# Patient Record
Sex: Male | Born: 1955 | Race: White | Hispanic: No | Smoking: Former smoker
Health system: Southern US, Community
[De-identification: ages and names within clinical notes are randomized; demographics above are authoritative.]

## PROBLEM LIST (undated history)

## (undated) DIAGNOSIS — R252 Cramp and spasm: Secondary | ICD-10-CM

## (undated) DIAGNOSIS — H9190 Unspecified hearing loss, unspecified ear: Secondary | ICD-10-CM

## (undated) DIAGNOSIS — I10 Essential (primary) hypertension: Secondary | ICD-10-CM

## (undated) DIAGNOSIS — J9690 Respiratory failure, unspecified, unspecified whether with hypoxia or hypercapnia: Secondary | ICD-10-CM

## (undated) DIAGNOSIS — L89159 Pressure ulcer of sacral region, unspecified stage: Secondary | ICD-10-CM

## (undated) DIAGNOSIS — Z87442 Personal history of urinary calculi: Secondary | ICD-10-CM

## (undated) DIAGNOSIS — R51 Headache: Secondary | ICD-10-CM

## (undated) DIAGNOSIS — N390 Urinary tract infection, site not specified: Secondary | ICD-10-CM

## (undated) DIAGNOSIS — R6521 Severe sepsis with septic shock: Secondary | ICD-10-CM

## (undated) DIAGNOSIS — E7800 Pure hypercholesterolemia, unspecified: Secondary | ICD-10-CM

## (undated) DIAGNOSIS — M8660 Other chronic osteomyelitis, unspecified site: Secondary | ICD-10-CM

## (undated) DIAGNOSIS — Z960 Presence of urogenital implants: Secondary | ICD-10-CM

## (undated) DIAGNOSIS — A419 Sepsis, unspecified organism: Secondary | ICD-10-CM

## (undated) DIAGNOSIS — R569 Unspecified convulsions: Secondary | ICD-10-CM

## (undated) DIAGNOSIS — J189 Pneumonia, unspecified organism: Secondary | ICD-10-CM

## (undated) DIAGNOSIS — C801 Malignant (primary) neoplasm, unspecified: Secondary | ICD-10-CM

## (undated) DIAGNOSIS — Z978 Presence of other specified devices: Secondary | ICD-10-CM

## (undated) DIAGNOSIS — N10 Acute tubulo-interstitial nephritis: Secondary | ICD-10-CM

## (undated) DIAGNOSIS — I6789 Other cerebrovascular disease: Secondary | ICD-10-CM

## (undated) DIAGNOSIS — R42 Dizziness and giddiness: Secondary | ICD-10-CM

## (undated) DIAGNOSIS — K219 Gastro-esophageal reflux disease without esophagitis: Secondary | ICD-10-CM

## (undated) DIAGNOSIS — K922 Gastrointestinal hemorrhage, unspecified: Secondary | ICD-10-CM

## (undated) DIAGNOSIS — E119 Type 2 diabetes mellitus without complications: Secondary | ICD-10-CM

## (undated) DIAGNOSIS — N400 Enlarged prostate without lower urinary tract symptoms: Secondary | ICD-10-CM

## (undated) DIAGNOSIS — F0390 Unspecified dementia without behavioral disturbance: Secondary | ICD-10-CM

## (undated) HISTORY — PX: HERNIA REPAIR: SHX51

## (undated) HISTORY — PX: OTHER SURGICAL HISTORY: SHX169

---

## 2004-01-10 ENCOUNTER — Emergency Department (HOSPITAL_COMMUNITY): Admission: EM | Admit: 2004-01-10 | Discharge: 2004-01-10 | Payer: Self-pay | Admitting: Emergency Medicine

## 2004-06-03 ENCOUNTER — Emergency Department (HOSPITAL_COMMUNITY): Admission: EM | Admit: 2004-06-03 | Discharge: 2004-06-03 | Payer: Self-pay | Admitting: Family Medicine

## 2004-10-23 ENCOUNTER — Emergency Department (HOSPITAL_COMMUNITY): Admission: EM | Admit: 2004-10-23 | Discharge: 2004-10-24 | Payer: Self-pay | Admitting: Emergency Medicine

## 2004-12-01 ENCOUNTER — Emergency Department (HOSPITAL_COMMUNITY): Admission: EM | Admit: 2004-12-01 | Discharge: 2004-12-01 | Payer: Self-pay | Admitting: Family Medicine

## 2006-05-07 ENCOUNTER — Inpatient Hospital Stay (HOSPITAL_COMMUNITY): Admission: EM | Admit: 2006-05-07 | Discharge: 2006-05-09 | Payer: Self-pay | Admitting: Emergency Medicine

## 2006-05-08 ENCOUNTER — Encounter (INDEPENDENT_AMBULATORY_CARE_PROVIDER_SITE_OTHER): Payer: Self-pay | Admitting: Interventional Cardiology

## 2006-05-08 ENCOUNTER — Ambulatory Visit: Payer: Self-pay | Admitting: Vascular Surgery

## 2006-05-08 ENCOUNTER — Encounter: Payer: Self-pay | Admitting: Vascular Surgery

## 2006-05-08 ENCOUNTER — Encounter: Payer: Self-pay | Admitting: Cardiology

## 2006-05-16 ENCOUNTER — Ambulatory Visit: Payer: Self-pay | Admitting: Cardiology

## 2006-05-23 ENCOUNTER — Ambulatory Visit (HOSPITAL_COMMUNITY): Admission: RE | Admit: 2006-05-23 | Discharge: 2006-05-23 | Payer: Self-pay | Admitting: Neurology

## 2006-05-30 ENCOUNTER — Ambulatory Visit (HOSPITAL_COMMUNITY): Admission: RE | Admit: 2006-05-30 | Discharge: 2006-05-30 | Payer: Self-pay | Admitting: Neurology

## 2006-06-20 ENCOUNTER — Ambulatory Visit: Payer: Self-pay | Admitting: Cardiology

## 2006-06-20 ENCOUNTER — Encounter: Payer: Self-pay | Admitting: Physician Assistant

## 2006-06-27 ENCOUNTER — Ambulatory Visit: Payer: Self-pay | Admitting: Cardiology

## 2006-06-27 ENCOUNTER — Encounter: Payer: Self-pay | Admitting: Cardiology

## 2008-01-02 ENCOUNTER — Ambulatory Visit (HOSPITAL_COMMUNITY): Admission: RE | Admit: 2008-01-02 | Discharge: 2008-01-02 | Payer: Self-pay | Admitting: Family Medicine

## 2008-01-02 ENCOUNTER — Ambulatory Visit (HOSPITAL_COMMUNITY): Admission: RE | Admit: 2008-01-02 | Discharge: 2008-01-02 | Payer: Self-pay | Admitting: Urology

## 2008-01-08 ENCOUNTER — Ambulatory Visit (HOSPITAL_COMMUNITY): Admission: RE | Admit: 2008-01-08 | Discharge: 2008-01-08 | Payer: Self-pay | Admitting: Urology

## 2008-01-10 ENCOUNTER — Emergency Department (HOSPITAL_COMMUNITY): Admission: EM | Admit: 2008-01-10 | Discharge: 2008-01-10 | Payer: Self-pay | Admitting: Emergency Medicine

## 2008-01-13 ENCOUNTER — Ambulatory Visit (HOSPITAL_COMMUNITY): Admission: RE | Admit: 2008-01-13 | Discharge: 2008-01-13 | Payer: Self-pay | Admitting: Urology

## 2008-01-22 ENCOUNTER — Ambulatory Visit (HOSPITAL_COMMUNITY): Admission: RE | Admit: 2008-01-22 | Discharge: 2008-01-22 | Payer: Self-pay | Admitting: Urology

## 2008-02-24 ENCOUNTER — Ambulatory Visit: Payer: Self-pay | Admitting: Internal Medicine

## 2008-02-24 ENCOUNTER — Encounter: Payer: Self-pay | Admitting: Cardiology

## 2008-02-24 ENCOUNTER — Inpatient Hospital Stay (HOSPITAL_COMMUNITY): Admission: EM | Admit: 2008-02-24 | Discharge: 2008-02-25 | Payer: Self-pay | Admitting: Cardiology

## 2008-02-24 ENCOUNTER — Encounter: Payer: Self-pay | Admitting: Emergency Medicine

## 2008-03-16 ENCOUNTER — Ambulatory Visit: Payer: Self-pay | Admitting: Cardiology

## 2008-03-23 ENCOUNTER — Encounter: Payer: Self-pay | Admitting: Cardiology

## 2008-08-13 ENCOUNTER — Encounter (INDEPENDENT_AMBULATORY_CARE_PROVIDER_SITE_OTHER): Payer: Self-pay | Admitting: *Deleted

## 2008-08-13 ENCOUNTER — Encounter: Payer: Self-pay | Admitting: Physician Assistant

## 2008-09-08 ENCOUNTER — Ambulatory Visit: Payer: Self-pay | Admitting: Gastroenterology

## 2008-09-08 DIAGNOSIS — R634 Abnormal weight loss: Secondary | ICD-10-CM

## 2008-09-08 DIAGNOSIS — K921 Melena: Secondary | ICD-10-CM

## 2008-09-08 DIAGNOSIS — R110 Nausea: Secondary | ICD-10-CM

## 2008-09-08 DIAGNOSIS — Z8639 Personal history of other endocrine, nutritional and metabolic disease: Secondary | ICD-10-CM

## 2008-09-08 DIAGNOSIS — R109 Unspecified abdominal pain: Secondary | ICD-10-CM

## 2008-09-08 DIAGNOSIS — R198 Other specified symptoms and signs involving the digestive system and abdomen: Secondary | ICD-10-CM | POA: Insufficient documentation

## 2008-09-08 DIAGNOSIS — Z862 Personal history of diseases of the blood and blood-forming organs and certain disorders involving the immune mechanism: Secondary | ICD-10-CM

## 2008-09-09 ENCOUNTER — Encounter: Payer: Self-pay | Admitting: Gastroenterology

## 2008-09-11 LAB — CONVERTED CEMR LAB
ALT: <8 units/L (ref 0–53)
AST: 9 units/L (ref 0–37)
Alkaline Phosphatase: 65 units/L (ref 39–117)
BUN: 15 mg/dL (ref 6–23)
CO2: 22 meq/L (ref 19–32)
Creatinine, Ser: 1.21 mg/dL (ref 0.40–1.50)
Eosinophils Absolute: 0.1 10*3/uL (ref 0.0–0.7)
HCT: 43.8 % (ref 39.0–52.0)
Lymphs Abs: 2.2 10*3/uL (ref 0.7–4.0)
MCHC: 33.6 g/dL (ref 30.0–36.0)
Monocytes Absolute: 0.4 10*3/uL (ref 0.1–1.0)
Monocytes Relative: 5 % (ref 3–12)
Neutro Abs: 5.3 10*3/uL (ref 1.7–7.7)
Neutrophils Relative %: 66 % (ref 43–77)
Platelets: 238 10*3/uL (ref 150–400)
RBC: 5.15 M/uL (ref 4.22–5.81)
RDW: 13.3 % (ref 11.5–15.5)
Sodium: 143 meq/L (ref 135–145)
TSH: 0.714 microintl units/mL (ref 0.350–4.500)
Total Protein: 6.3 g/dL (ref 6.0–8.3)
WBC: 8.1 10*3/uL (ref 4.0–10.5)

## 2008-09-16 ENCOUNTER — Encounter: Payer: Self-pay | Admitting: Gastroenterology

## 2008-09-28 ENCOUNTER — Telehealth (INDEPENDENT_AMBULATORY_CARE_PROVIDER_SITE_OTHER): Payer: Self-pay | Admitting: *Deleted

## 2008-10-05 ENCOUNTER — Encounter: Payer: Self-pay | Admitting: Gastroenterology

## 2008-10-16 ENCOUNTER — Encounter: Payer: Self-pay | Admitting: Gastroenterology

## 2008-10-16 ENCOUNTER — Ambulatory Visit (HOSPITAL_COMMUNITY): Admission: RE | Admit: 2008-10-16 | Discharge: 2008-10-16 | Payer: Self-pay | Admitting: Gastroenterology

## 2008-10-16 ENCOUNTER — Ambulatory Visit: Payer: Self-pay | Admitting: Gastroenterology

## 2008-11-18 ENCOUNTER — Inpatient Hospital Stay (HOSPITAL_COMMUNITY): Admission: EM | Admit: 2008-11-18 | Discharge: 2008-11-20 | Payer: Self-pay | Admitting: Emergency Medicine

## 2008-11-24 ENCOUNTER — Encounter (HOSPITAL_COMMUNITY): Admission: RE | Admit: 2008-11-24 | Discharge: 2008-12-24 | Payer: Self-pay | Admitting: Internal Medicine

## 2008-11-26 ENCOUNTER — Ambulatory Visit: Payer: Self-pay | Admitting: Gastroenterology

## 2008-11-26 DIAGNOSIS — K5900 Constipation, unspecified: Secondary | ICD-10-CM | POA: Insufficient documentation

## 2008-11-27 ENCOUNTER — Encounter: Payer: Self-pay | Admitting: Emergency Medicine

## 2008-11-27 ENCOUNTER — Encounter (INDEPENDENT_AMBULATORY_CARE_PROVIDER_SITE_OTHER): Payer: Self-pay | Admitting: Neurology

## 2008-11-27 ENCOUNTER — Inpatient Hospital Stay (HOSPITAL_COMMUNITY): Admission: EM | Admit: 2008-11-27 | Discharge: 2008-12-01 | Payer: Self-pay | Admitting: Neurology

## 2008-11-28 DIAGNOSIS — I10 Essential (primary) hypertension: Secondary | ICD-10-CM | POA: Insufficient documentation

## 2008-11-28 DIAGNOSIS — I2510 Atherosclerotic heart disease of native coronary artery without angina pectoris: Secondary | ICD-10-CM

## 2008-11-28 DIAGNOSIS — R011 Cardiac murmur, unspecified: Secondary | ICD-10-CM | POA: Insufficient documentation

## 2008-11-28 DIAGNOSIS — E785 Hyperlipidemia, unspecified: Secondary | ICD-10-CM | POA: Insufficient documentation

## 2008-11-30 ENCOUNTER — Encounter (INDEPENDENT_AMBULATORY_CARE_PROVIDER_SITE_OTHER): Payer: Self-pay | Admitting: Neurology

## 2008-12-15 ENCOUNTER — Encounter (HOSPITAL_COMMUNITY): Admission: RE | Admit: 2008-12-15 | Discharge: 2009-01-14 | Payer: Self-pay | Admitting: Neurology

## 2009-01-19 ENCOUNTER — Encounter (HOSPITAL_COMMUNITY): Admission: RE | Admit: 2009-01-19 | Discharge: 2009-02-17 | Payer: Self-pay | Admitting: Neurology

## 2009-01-26 ENCOUNTER — Emergency Department (HOSPITAL_COMMUNITY): Admission: EM | Admit: 2009-01-26 | Discharge: 2009-01-26 | Payer: Self-pay | Admitting: Emergency Medicine

## 2009-02-24 ENCOUNTER — Encounter (HOSPITAL_COMMUNITY): Admission: RE | Admit: 2009-02-24 | Discharge: 2009-03-26 | Payer: Self-pay | Admitting: Neurology

## 2009-08-19 ENCOUNTER — Ambulatory Visit (HOSPITAL_COMMUNITY): Admission: RE | Admit: 2009-08-19 | Discharge: 2009-08-19 | Payer: Self-pay | Admitting: Urology

## 2009-08-25 ENCOUNTER — Ambulatory Visit (HOSPITAL_COMMUNITY): Admission: RE | Admit: 2009-08-25 | Discharge: 2009-08-25 | Payer: Self-pay | Admitting: Urology

## 2009-08-26 ENCOUNTER — Emergency Department (HOSPITAL_COMMUNITY): Admission: EM | Admit: 2009-08-26 | Discharge: 2009-08-27 | Payer: Self-pay | Admitting: Emergency Medicine

## 2009-09-03 ENCOUNTER — Encounter: Admission: RE | Admit: 2009-09-03 | Discharge: 2009-09-03 | Payer: Self-pay | Admitting: Neurology

## 2009-09-07 ENCOUNTER — Telehealth (INDEPENDENT_AMBULATORY_CARE_PROVIDER_SITE_OTHER): Payer: Self-pay | Admitting: *Deleted

## 2009-09-13 ENCOUNTER — Ambulatory Visit: Payer: Self-pay | Admitting: Physician Assistant

## 2009-09-17 ENCOUNTER — Encounter: Payer: Self-pay | Admitting: Physician Assistant

## 2009-09-17 ENCOUNTER — Telehealth (INDEPENDENT_AMBULATORY_CARE_PROVIDER_SITE_OTHER): Payer: Self-pay | Admitting: *Deleted

## 2009-10-20 ENCOUNTER — Ambulatory Visit (HOSPITAL_COMMUNITY): Admission: RE | Admit: 2009-10-20 | Discharge: 2009-10-20 | Payer: Self-pay | Admitting: General Surgery

## 2009-11-24 ENCOUNTER — Emergency Department (HOSPITAL_COMMUNITY): Admission: EM | Admit: 2009-11-24 | Discharge: 2009-11-25 | Payer: Self-pay | Admitting: Emergency Medicine

## 2010-01-24 ENCOUNTER — Inpatient Hospital Stay (HOSPITAL_COMMUNITY): Admission: EM | Admit: 2010-01-24 | Discharge: 2010-01-26 | Payer: Self-pay | Source: Home / Self Care

## 2010-01-25 ENCOUNTER — Encounter (INDEPENDENT_AMBULATORY_CARE_PROVIDER_SITE_OTHER): Payer: Self-pay | Admitting: Internal Medicine

## 2010-02-20 DIAGNOSIS — G9689 Other specified disorders of central nervous system: Secondary | ICD-10-CM

## 2010-02-20 HISTORY — DX: Other specified disorders of central nervous system: G96.89

## 2010-03-13 ENCOUNTER — Encounter: Payer: Self-pay | Admitting: Urology

## 2010-03-22 NOTE — Assessment & Plan Note (Signed)
Summary: needs pre-op clearance for lithotripsy --agh  Medications Added METOPROLOL TARTRATE 50 MG TABS (METOPROLOL TARTRATE) Take 1 tablet by mouth two times a day DEPAKOTE ER 500 MG XR24H-TAB (DIVALPROEX SODIUM) Take 1 tablet by mouth once a day ASPIR-TRIN 325 MG TBEC (ASPIRIN) Take 1 tablet by mouth once a day SIMVASTATIN 40 MG TABS (SIMVASTATIN) Take 1 tablet by mouth once a day      Allergies Added: NKDA  Visit Type:  surgical clearance for kidney stone removal Referring Provider:  McGough Primary Provider:  Dr. Regino Schultze   History of Present Illness: patient is a 55 year old male with history of nonobstructive CAD and normal LV function. He was last seen here in the clinic in January 2010, by Dr. Andee Lineman. He now presents for repeat preoperative clearance, again for upcoming lithotripsy. This time, he was found to have left-sided nephrolithiasis, and is scheduled to undergo lithotripsy later this week.  From a cardiac standpoint, he denies any interim developed of symptoms suggestive of unstable angina pectoris. He denies exertional dyspnea or tachycardia palpitations. Unfortunately continues to smoke.  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 He was treated with TPA. An echocardiogram at that time indicated normal LVF (EF 60-65%), moderate aortic regurgitation, and no cardiac source of emboli.  Preventive Screening-Counseling & Management  Alcohol-Tobacco     Smoking Status: current     Smoking Cessation Counseling: yes     Packs/Day: <1/2 PPD  Current Medications (verified): 1)  Lisinopril 10 Mg Tabs (Lisinopril) .... Take 1 Tablet By Mouth Once A Day 2)  Aciphex 20 Mg Tbec (Rabeprazole Sodium) .... Take 1 Tablet By Mouth Two Times A Day 3)  Metoprolol Tartrate 50 Mg Tabs (Metoprolol Tartrate) .... Take 1 Tablet By Mouth Two Times A Day 4)  Depakote Er 500 Mg Xr24h-Tab (Divalproex  Sodium) .... Take 1 Tablet By Mouth Once A Day 5)  Aspir-Trin 325 Mg Tbec (Aspirin) .... Take 1 Tablet By Mouth Once A Day 6)  Simvastatin 40 Mg Tabs (Simvastatin) .... Take 1 Tablet By Mouth Once A Day  Allergies (verified): No Known Drug Allergies  Comments:  Nurse/Medical Assistant: The patient's medication list and allergies were reviewed with the patient and were updated in the Medication and Allergy Lists.  Past History:  Past Medical History: Diabetes Hearing loss Kidney Stones, lithotripsy, Dr. Jerre Simon, h/o stent before CAD, NATIVE VESSEL (ICD-414.01) MURMUR (ICD-785.2) HYPERTENSION, UNSPECIFIED (ICD-401.9) HYPERLIPIDEMIA-MIXED (ICD-272.4) CONSTIPATION (ICD-564.00) Hx of THYROID NODULE, HX OF (ICD-V12.2) HEMATOCHEZIA (ICD-578.1) NAUSEA, CHRONIC (ICD-787.02) ABDOMINAL PAIN OTHER SPECIFIED SITE (ICD-789.09) CHANGE IN BOWELS (ICD-787.99) Left brain stroke, 10/10 WEIGHT LOSS, ABNORMAL (ICD-783.21)  Social History: Smoking Status:  current Packs/Day:  <1/2 PPD  Review of Systems       No fevers, chills, hemoptysis, dysphagia, melena, hematocheezia, hematuria, rash, claudication, orthopnea, pnd, pedal edema. patient has residual right-sided hemiparesis some and ambulates with use of cane or walker. BasedAll other systems negative.   Vital Signs:  Patient profile:   55 year old male Height:      72 inches Weight:      170 pounds BMI:     23.14 Pulse rate:   93 / minute BP sitting:   102 / 68  (left arm) Cuff size:   regular  Vitals Entered By: Carlye Grippe (September 13, 2009 1:05 PM)  Physical Exam  Additional Exam:  GEN:55 year old male, sitting upright, in no distress HEENT: NCAT,PERRLA,EOMI NECK:  palpable pulses, no bruits; no JVD; left sided thyroid mass LUNGS: CTA bilaterally HEART: RRR (S1S2); no significant murmurs; no rubs; no gallops ABD: soft, NT; intact BS EXT: intact distal pulses; no edema SKIN: warm, dry MUSC: no obvious deformity NEURO: A/O  (x3)     EKG  Procedure date:  09/13/2009  Findings:      normal sinus rhythm and 96 bpm; normal axis; no acute changes  Impression & Recommendations:  Problem # 1:  CAD, NATIVE VESSEL (ICD-414.01)  no further cardiac workup is indicated. Patient has history of nonobstructive CAD with normal LV function by previous catheterization, and presents with no interim development of signs or symptoms suggestive of unstable angina pectoris. Therefore, he is cleared to proceed with lithotripsy later this week, with Dr. Jerre Simon. He is to otherwise return for clinic followup with Dr. Andee Lineman in one year.  Problem # 2:  Hx of THYROID NODULE, HX OF (ICD-V12.2)  a repeat ultrasound of the thyroid will be ordered, with results forwarded to Dr. Regino Schultze. His previous study, February 2010, revealed evidence of multinodular goiter, with a dominant partially calcified mass in the previously biopsied left lobe of the thyroid. Recommend that patient followup with an endocrinologist for review of these results. Will defer to Dr.McGough for further recommendations.  Orders: Ultrasound (Ultrasound)  Problem # 3:  HYPERLIPIDEMIA-MIXED (ICD-272.4)  aggressive lipid management recommended, with target LDL of 70 or less, if feasible. We'll defer to Dr. Regino Schultze for ongoing monitoring and management.  Other Orders: EKG w/ Interpretation (93000)  Patient Instructions: 1)  Your physician wants you to follow-up in: 1 year. You will receive a reminder letter in the mail one-two months in advance. If you don't receive a letter, please call our office to schedule the follow-up appointment. 2)  Thyroid Ultrasound. 3)  You are cleared for Lithotripsy with Dr. Jerre Simon. A copy of your office visit from today will be faxed to him.

## 2010-03-22 NOTE — Progress Notes (Signed)
Summary: surgical clearance   Phone Note Other Incoming   Caller: [facility] via fax Summary of Call: Requesting surgical clearance for hernia repair.  Last seen July for clearance for lithotripsy.  Can you make addendum to that note for this surgery? Hoover Brunette, LPN  September 17, 2009 11:40 AM   Follow-up for Phone Call        See flag.  Follow-up by: Lewayne Bunting, MD, [facility],  September 19, 2009 3:49 PM  Additional Follow-up for Phone Call Additional follow up Details #1::        Patient not seen by me since 02/2008. Probably should have brief visit per Dr. Andee Lineman.  You are correct, but Gene saw him this July for clearance for lithotripsy.  Do we really have to make him come in again or can Gene poss. clear based off his note?  If you still want to see him, just let me know when and where to add to your schedule. Forward to Gene.  Lewayne Bunting, MD, [facility]  September 22, 2009 1:19 PM    Additional Follow-up by: Hoover Brunette, LPN,  September 21, 2009 4:31 PM    Additional Follow-up for Phone Call Additional follow up Details #2::    there is no need for pt to come back to the office. he was cleared for lithotripsy, by me, on 7/25, and is cleared to now proceed with hernia repair, unless he has developed angina pectoris, since his recent office visit. Follow-up by: Nelida Meuse, PA-C,  September 22, 2009 1:36 PM  Additional Follow-up for Phone Call Additional follow up Details #3:: Details for Additional Follow-up Action Taken: Will fax this note to [facility]. Hoover Brunette, LPN  September 22, 2009 1:50 PM

## 2010-03-22 NOTE — Cardiovascular Report (Signed)
Summary: Cardiac Catheterization  Cardiac Catheterization   Imported By: Dorise Hiss 10/07/2009 14:07:16  _____________________________________________________________________  External Attachment:    Type:   Image     Comment:   External Document

## 2010-03-22 NOTE — Progress Notes (Signed)
Summary: PHONE: PRE OP CLEARANCE     Phone Note From Other Clinic   Caller: [email or web address]-[email or web address] Request: Talk with Provider Details for Reason: [phone]-PRE OP CLEARANCE Summary of Call: Mr. Counterman needs to have Lipthotritsy (has not been scheduled yet) Dr. Jerre Simon is wanting to know if he needs clearance before this procedure. Initial call taken by: Zachary George,  September 07, 2009 10:25 AM  Follow-up for Phone Call        Notified Rosann Auerbach, patient not seen since 02/2008.  Will need OV here for clearance first.  Stated she would cancell procedure for tomorrow. Left message for pt to return call. Hoover Brunette, LPN  September 07, 2009 12:21 PM   Additional Follow-up for Phone Call Additional follow up Details #1::        OV scheduled for 7/25 with GS for clearance. Girlfriend Maureen Ralphs Shorter) notified.    Additional Follow-up by: Hoover Brunette, LPN,  September 08, 2009 4:12 PM

## 2010-05-03 LAB — CK TOTAL AND CKMB (NOT AT ARMC): Relative Index: INVALID (ref 0.0–2.5)

## 2010-05-03 LAB — COMPREHENSIVE METABOLIC PANEL
ALT: 10 U/L (ref 0–53)
ALT: 14 U/L (ref 0–53)
AST: 14 U/L (ref 0–37)
AST: 15 U/L (ref 0–37)
Alkaline Phosphatase: 72 U/L (ref 39–117)
CO2: 24 mEq/L (ref 19–32)
Calcium: 10 mg/dL (ref 8.4–10.5)
Calcium: 10.6 mg/dL — ABNORMAL HIGH (ref 8.4–10.5)
GFR calc Af Amer: >60 mL/min (ref >60)
GFR calc Af Amer: >60 mL/min (ref >60)
GFR calc non Af Amer: >60 mL/min (ref >60)
Glucose, Bld: 114 mg/dL — ABNORMAL HIGH (ref 70–99)
Potassium: 4.1 mEq/L (ref 3.5–5.1)
Sodium: 143 mEq/L (ref 135–145)
Sodium: 144 mEq/L (ref 135–145)
Total Protein: 6 g/dL (ref 6.0–8.3)

## 2010-05-03 LAB — VITAMIN D 1,25 DIHYDROXY
Vitamin D 1, 25 (OH)2 Total: 48 pg/mL (ref 18–72)
Vitamin D2 1, 25 (OH)2: <8 pg/mL
Vitamin D3 1, 25 (OH)2: 48 pg/mL

## 2010-05-03 LAB — T4, FREE: Free T4: 0.99 ng/dL (ref 0.80–1.80)

## 2010-05-03 LAB — GLUCOSE, CAPILLARY: Glucose-Capillary: 107 mg/dL — ABNORMAL HIGH (ref 70–99)

## 2010-05-03 LAB — CBC
MCH: 29.7 pg (ref 26.0–34.0)
MCHC: 34.1 g/dL (ref 30.0–36.0)
MCHC: 35.8 g/dL (ref 30.0–36.0)
MCV: 82.9 fL (ref 78.0–100.0)
Platelets: 210 10*3/uL (ref 150–400)
Platelets: 235 10*3/uL (ref 150–400)
RBC: 5.02 MIL/uL (ref 4.22–5.81)
RDW: 12.9 % (ref 11.5–15.5)

## 2010-05-03 LAB — LIPID PANEL
Cholesterol: 231 mg/dL — ABNORMAL HIGH (ref 0–200)
LDL Cholesterol: 152 mg/dL — ABNORMAL HIGH (ref 0–99)

## 2010-05-03 LAB — HOMOCYSTEINE: Homocysteine: 10.9 umol/L (ref 4.0–15.4)

## 2010-05-03 LAB — DIFFERENTIAL
Basophils Relative: 0 % (ref 0–1)
Eosinophils Absolute: 0.2 10*3/uL (ref 0.0–0.7)
Eosinophils Absolute: 0.2 10*3/uL (ref 0.0–0.7)
Eosinophils Relative: 2 % (ref 0–5)
Lymphs Abs: 2.4 10*3/uL (ref 0.7–4.0)
Lymphs Abs: 3.5 10*3/uL (ref 0.7–4.0)
Monocytes Relative: 5 % (ref 3–12)
Monocytes Relative: 6 % (ref 3–12)
Neutrophils Relative %: 63 % (ref 43–77)

## 2010-05-03 LAB — PROTIME-INR: Prothrombin Time: 12.1 seconds (ref 11.6–15.2)

## 2010-05-05 LAB — CBC
HCT: 39.8 % (ref 39.0–52.0)
Hemoglobin: 13.8 g/dL (ref 13.0–17.0)
MCV: 87.1 fL (ref 78.0–100.0)
RBC: 4.57 MIL/uL (ref 4.22–5.81)
WBC: 9.4 10*3/uL (ref 4.0–10.5)

## 2010-05-05 LAB — DIFFERENTIAL
Eosinophils Relative: 2 % (ref 0–5)
Lymphocytes Relative: 33 % (ref 12–46)
Lymphs Abs: 3.1 10*3/uL (ref 0.7–4.0)
Monocytes Absolute: 0.5 10*3/uL (ref 0.1–1.0)
Monocytes Relative: 5 % (ref 3–12)

## 2010-05-05 LAB — URINALYSIS, ROUTINE W REFLEX MICROSCOPIC
Glucose, UA: NEGATIVE mg/dL
Ketones, ur: NEGATIVE mg/dL
pH: 6 (ref 5.0–8.0)

## 2010-05-05 LAB — BASIC METABOLIC PANEL
BUN: 14 mg/dL (ref 6–23)
Chloride: 106 mEq/L (ref 96–112)
GFR calc Af Amer: >60 mL/min (ref >60)
Potassium: 4.4 mEq/L (ref 3.5–5.1)

## 2010-05-06 LAB — CBC
Hemoglobin: 15.4 g/dL (ref 13.0–17.0)
MCHC: 34.2 g/dL (ref 30.0–36.0)
Platelets: 204 10*3/uL (ref 150–400)
RBC: 5.1 MIL/uL (ref 4.22–5.81)

## 2010-05-06 LAB — GLUCOSE, CAPILLARY: Glucose-Capillary: 143 mg/dL — ABNORMAL HIGH (ref 70–99)

## 2010-05-06 LAB — SURGICAL PCR SCREEN
MRSA, PCR: NEGATIVE
Staphylococcus aureus: NEGATIVE

## 2010-05-06 LAB — COMPREHENSIVE METABOLIC PANEL
AST: 18 U/L (ref 0–37)
Alkaline Phosphatase: 54 U/L (ref 39–117)
BUN: 14 mg/dL (ref 6–23)
CO2: 29 mEq/L (ref 19–32)
Chloride: 108 mEq/L (ref 96–112)
Creatinine, Ser: 1.18 mg/dL (ref 0.4–1.5)
GFR calc non Af Amer: >60 mL/min (ref >60)
Total Bilirubin: 0.6 mg/dL (ref 0.3–1.2)

## 2010-05-08 LAB — URINALYSIS, ROUTINE W REFLEX MICROSCOPIC
Bilirubin Urine: NEGATIVE
Glucose, UA: NEGATIVE mg/dL
pH: 6 (ref 5.0–8.0)

## 2010-05-08 LAB — BASIC METABOLIC PANEL
BUN: 17 mg/dL (ref 6–23)
CO2: 24 mEq/L (ref 19–32)
Chloride: 110 mEq/L (ref 96–112)
Potassium: 4 mEq/L (ref 3.5–5.1)

## 2010-05-08 LAB — CBC
HCT: 40.2 % (ref 39.0–52.0)
MCH: 29.6 pg (ref 26.0–34.0)
MCV: 85.5 fL (ref 78.0–100.0)
RDW: 13.4 % (ref 11.5–15.5)
WBC: 8.8 10*3/uL (ref 4.0–10.5)

## 2010-05-08 LAB — DIFFERENTIAL
Basophils Absolute: 0 10*3/uL (ref 0.0–0.1)
Eosinophils Relative: 1 % (ref 0–5)
Lymphocytes Relative: 22 % (ref 12–46)
Lymphs Abs: 1.9 10*3/uL (ref 0.7–4.0)
Monocytes Absolute: 0.4 10*3/uL (ref 0.1–1.0)

## 2010-05-24 LAB — URINALYSIS, ROUTINE W REFLEX MICROSCOPIC
Bilirubin Urine: NEGATIVE
Hgb urine dipstick: NEGATIVE
Specific Gravity, Urine: <1.005 — ABNORMAL LOW (ref 1.005–1.030)
pH: 6.5 (ref 5.0–8.0)

## 2010-05-26 LAB — GLUCOSE, CAPILLARY
Glucose-Capillary: 108 mg/dL — ABNORMAL HIGH (ref 70–99)
Glucose-Capillary: 128 mg/dL — ABNORMAL HIGH (ref 70–99)
Glucose-Capillary: 134 mg/dL — ABNORMAL HIGH (ref 70–99)
Glucose-Capillary: 92 mg/dL (ref 70–99)
Glucose-Capillary: 96 mg/dL (ref 70–99)
Glucose-Capillary: 98 mg/dL (ref 70–99)
Glucose-Capillary: 104 mg/dL — ABNORMAL HIGH (ref 70–99)
Glucose-Capillary: 122 mg/dL — ABNORMAL HIGH (ref 70–99)
Glucose-Capillary: 92 mg/dL (ref 70–99)

## 2010-05-26 LAB — DIFFERENTIAL
Eosinophils Relative: 1 % (ref 0–5)
Lymphocytes Relative: 28 % (ref 12–46)
Lymphs Abs: 2.2 10*3/uL (ref 0.7–4.0)
Monocytes Absolute: 0.3 10*3/uL (ref 0.1–1.0)

## 2010-05-26 LAB — COMPREHENSIVE METABOLIC PANEL
AST: 15 U/L (ref 0–37)
Albumin: 3.9 g/dL (ref 3.5–5.2)
Albumin: 4.3 g/dL (ref 3.5–5.2)
BUN: 13 mg/dL (ref 6–23)
Calcium: 10.4 mg/dL (ref 8.4–10.5)
Chloride: 108 mEq/L (ref 96–112)
Creatinine, Ser: 1.16 mg/dL (ref 0.4–1.5)
Creatinine, Ser: 0.9 mg/dL (ref 0.4–1.5)
GFR calc Af Amer: >60 mL/min (ref >60)
GFR calc non Af Amer: >60 mL/min (ref >60)
Glucose, Bld: 91 mg/dL (ref 70–99)
Total Bilirubin: 0.5 mg/dL (ref 0.3–1.2)
Total Protein: 6.8 g/dL (ref 6.0–8.3)

## 2010-05-26 LAB — CARDIAC PANEL(CRET KIN+CKTOT+MB+TROPI): Troponin I: 0.03 ng/mL (ref 0.00–0.06)

## 2010-05-26 LAB — CK TOTAL AND CKMB (NOT AT ARMC)
CK, MB: 2 ng/mL (ref 0.3–4.0)
Relative Index: INVALID (ref 0.0–2.5)

## 2010-05-26 LAB — LIPID PANEL
Cholesterol: 232 mg/dL — ABNORMAL HIGH (ref 0–200)
Total CHOL/HDL Ratio: 5.8 RATIO
VLDL: 35 mg/dL (ref 0–40)

## 2010-05-26 LAB — HEMOGLOBIN A1C
Hgb A1c MFr Bld: 5.4 % (ref 4.6–6.1)
Mean Plasma Glucose: 108 mg/dL

## 2010-05-26 LAB — CBC
HCT: 42.4 % (ref 39.0–52.0)
MCV: 87.8 fL (ref 78.0–100.0)
Platelets: 220 10*3/uL (ref 150–400)
Platelets: 223 10*3/uL (ref 150–400)
RDW: 13.3 % (ref 11.5–15.5)
WBC: 7.6 10*3/uL (ref 4.0–10.5)
WBC: 8 10*3/uL (ref 4.0–10.5)

## 2010-05-26 LAB — URINALYSIS, ROUTINE W REFLEX MICROSCOPIC
Glucose, UA: NEGATIVE mg/dL
Hgb urine dipstick: NEGATIVE
Specific Gravity, Urine: 1.024 (ref 1.005–1.030)

## 2010-05-26 LAB — TROPONIN I: Troponin I: 0.03 ng/mL (ref 0.00–0.06)

## 2010-05-26 LAB — PROTIME-INR
Prothrombin Time: 11.9 seconds (ref 11.6–15.2)
Prothrombin Time: 12.3 seconds (ref 11.6–15.2)

## 2010-05-27 LAB — CARDIAC PANEL(CRET KIN+CKTOT+MB+TROPI)
CK, MB: 1.3 ng/mL (ref 0.3–4.0)
Relative Index: INVALID (ref 0.0–2.5)
Total CK: 35 U/L (ref 7–232)
Total CK: 39 U/L (ref 7–232)
Troponin I: 0.03 ng/mL (ref 0.00–0.06)

## 2010-05-27 LAB — COMPREHENSIVE METABOLIC PANEL
ALT: 8 U/L (ref 0–53)
ALT: 9 U/L (ref 0–53)
AST: 14 U/L (ref 0–37)
Albumin: 4.2 g/dL (ref 3.5–5.2)
Alkaline Phosphatase: 65 U/L (ref 39–117)
BUN: 10 mg/dL (ref 6–23)
CO2: 29 mEq/L (ref 19–32)
Calcium: 10 mg/dL (ref 8.4–10.5)
Chloride: 109 mEq/L (ref 96–112)
Creatinine, Ser: 0.88 mg/dL (ref 0.4–1.5)
GFR calc Af Amer: >60 mL/min (ref >60)
GFR calc Af Amer: >60 mL/min (ref >60)
GFR calc non Af Amer: >60 mL/min (ref >60)
Glucose, Bld: 93 mg/dL (ref 70–99)
Potassium: 4.1 mEq/L (ref 3.5–5.1)
Sodium: 142 mEq/L (ref 135–145)
Sodium: 144 mEq/L (ref 135–145)
Total Bilirubin: 0.6 mg/dL (ref 0.3–1.2)
Total Protein: 6 g/dL (ref 6.0–8.3)

## 2010-05-27 LAB — RAPID URINE DRUG SCREEN, HOSP PERFORMED
Barbiturates: NOT DETECTED
Benzodiazepines: NOT DETECTED
Cocaine: NOT DETECTED

## 2010-05-27 LAB — GLUCOSE, CAPILLARY
Glucose-Capillary: 120 mg/dL — ABNORMAL HIGH (ref 70–99)
Glucose-Capillary: 150 mg/dL — ABNORMAL HIGH (ref 70–99)
Glucose-Capillary: 83 mg/dL (ref 70–99)
Glucose-Capillary: 90 mg/dL (ref 70–99)

## 2010-05-27 LAB — FOLATE: Folate: 7.9 ng/mL

## 2010-05-27 LAB — ETHANOL: Alcohol, Ethyl (B): <5 mg/dL (ref 0–10)

## 2010-05-27 LAB — DIFFERENTIAL
Basophils Absolute: 0.1 10*3/uL (ref 0.0–0.1)
Basophils Relative: 1 % (ref 0–1)
Eosinophils Absolute: 0.1 10*3/uL (ref 0.0–0.7)
Eosinophils Relative: 2 % (ref 0–5)
Lymphocytes Relative: 34 % (ref 12–46)
Lymphs Abs: 2.3 10*3/uL (ref 0.7–4.0)
Monocytes Absolute: 0.5 10*3/uL (ref 0.1–1.0)
Monocytes Relative: 5 % (ref 3–12)
Monocytes Relative: 6 % (ref 3–12)
Neutrophils Relative %: 59 % (ref 43–77)

## 2010-05-27 LAB — PROTIME-INR
INR: 1 (ref 0.00–1.49)
Prothrombin Time: 13.1 seconds (ref 11.6–15.2)

## 2010-05-27 LAB — RPR: RPR Ser Ql: NONREACTIVE

## 2010-05-27 LAB — VITAMIN B12: Vitamin B-12: 247 pg/mL (ref 211–911)

## 2010-05-27 LAB — CBC
HCT: 42 % (ref 39.0–52.0)
Hemoglobin: 14.4 g/dL (ref 13.0–17.0)
Hemoglobin: 14.7 g/dL (ref 13.0–17.0)
MCHC: 34.6 g/dL (ref 30.0–36.0)
MCV: 87 fL (ref 78.0–100.0)
RBC: 4.84 MIL/uL (ref 4.22–5.81)
RDW: 13.1 % (ref 11.5–15.5)
RDW: 13.3 % (ref 11.5–15.5)

## 2010-05-27 LAB — URINALYSIS, ROUTINE W REFLEX MICROSCOPIC
Glucose, UA: NEGATIVE mg/dL
Hgb urine dipstick: NEGATIVE
Protein, ur: NEGATIVE mg/dL

## 2010-05-27 LAB — HEMOGLOBIN A1C: Mean Plasma Glucose: 111 mg/dL

## 2010-05-27 LAB — CK TOTAL AND CKMB (NOT AT ARMC): CK, MB: 1.8 ng/mL (ref 0.3–4.0)

## 2010-05-27 LAB — ANA: Anti Nuclear Antibody(ANA): NEGATIVE

## 2010-05-27 LAB — SEDIMENTATION RATE: Sed Rate: 1 mm/hr (ref 0–16)

## 2010-05-27 LAB — HOMOCYSTEINE: Homocysteine: 14.6 umol/L (ref 4.0–15.4)

## 2010-05-27 LAB — APTT: aPTT: 27 seconds (ref 24–37)

## 2010-06-06 LAB — CBC
HCT: 39.6 % (ref 39.0–52.0)
HCT: 44 % (ref 39.0–52.0)
Hemoglobin: 13.6 g/dL (ref 13.0–17.0)
Hemoglobin: 14.9 g/dL (ref 13.0–17.0)
Platelets: 220 10*3/uL (ref 150–400)
RDW: 13.3 % (ref 11.5–15.5)
WBC: 8.9 10*3/uL (ref 4.0–10.5)

## 2010-06-06 LAB — BASIC METABOLIC PANEL
GFR calc Af Amer: >60 mL/min (ref >60)
GFR calc non Af Amer: >60 mL/min (ref >60)
GFR calc non Af Amer: >60 mL/min (ref >60)
Glucose, Bld: 131 mg/dL — ABNORMAL HIGH (ref 70–99)
Potassium: 3.8 mEq/L (ref 3.5–5.1)
Potassium: 3.6 mEq/L (ref 3.5–5.1)
Sodium: 143 mEq/L (ref 135–145)
Sodium: 140 mEq/L (ref 135–145)

## 2010-06-06 LAB — CK TOTAL AND CKMB (NOT AT ARMC)
CK, MB: 1.1 ng/mL (ref 0.3–4.0)
CK, MB: 1.1 ng/mL (ref 0.3–4.0)
CK, MB: 1.3 ng/mL (ref 0.3–4.0)
Total CK: 32 U/L (ref 7–232)
Total CK: 39 U/L (ref 7–232)

## 2010-06-06 LAB — PROTIME-INR
INR: 1 (ref 0.00–1.49)
Prothrombin Time: 13.4 seconds (ref 11.6–15.2)

## 2010-06-06 LAB — POCT CARDIAC MARKERS
CKMB, poc: <1 ng/mL — ABNORMAL LOW (ref 1.0–8.0)
Myoglobin, poc: 30.2 ng/mL (ref 12–200)
Troponin i, poc: <0.05 ng/mL (ref 0.00–0.09)
Troponin i, poc: <0.05 ng/mL (ref 0.00–0.09)

## 2010-06-06 LAB — LIPID PANEL
Cholesterol: 215 mg/dL — ABNORMAL HIGH (ref 0–200)
LDL Cholesterol: 147 mg/dL — ABNORMAL HIGH (ref 0–99)
Total CHOL/HDL Ratio: 7.4 RATIO
Triglycerides: 194 mg/dL — ABNORMAL HIGH (ref <150)
VLDL: 39 mg/dL (ref 0–40)

## 2010-06-06 LAB — COMPREHENSIVE METABOLIC PANEL
AST: 11 U/L (ref 0–37)
Albumin: 3.4 g/dL — ABNORMAL LOW (ref 3.5–5.2)
Alkaline Phosphatase: 59 U/L (ref 39–117)
BUN: 14 mg/dL (ref 6–23)
Chloride: 109 mEq/L (ref 96–112)
GFR calc Af Amer: >60 mL/min (ref >60)
Potassium: 3.7 mEq/L (ref 3.5–5.1)
Sodium: 142 mEq/L (ref 135–145)
Total Bilirubin: 0.8 mg/dL (ref 0.3–1.2)
Total Protein: 5.7 g/dL — ABNORMAL LOW (ref 6.0–8.3)

## 2010-06-06 LAB — DIFFERENTIAL
Basophils Absolute: 0 10*3/uL (ref 0.0–0.1)
Eosinophils Relative: 1 % (ref 0–5)
Lymphocytes Relative: 33 % (ref 12–46)
Lymphs Abs: 3 10*3/uL (ref 0.7–4.0)
Monocytes Absolute: 0.4 10*3/uL (ref 0.1–1.0)
Monocytes Relative: 4 % (ref 3–12)

## 2010-06-06 LAB — APTT: aPTT: 28 seconds (ref 24–37)

## 2010-06-06 LAB — HEMOGLOBIN A1C: Mean Plasma Glucose: 117 mg/dL

## 2010-06-06 LAB — TROPONIN I: Troponin I: 0.01 ng/mL (ref 0.00–0.06)

## 2010-07-05 NOTE — Cardiovascular Report (Signed)
NAME:  Travis Phillips, Travis Phillips NO.:  [id number]   MEDICAL RECORD NO.:  [id number]           PATIENT TYPE:   LOCATION:                                 FACILITY:   PHYSICIAN:  Arturo Morton. Riley Kill, MD, FACCDATE OF BIRTH:  10-30-1955   DATE OF PROCEDURE:  02/24/2008  DATE OF DISCHARGE:                            CARDIAC CATHETERIZATION   INDICATIONS:  Mr. Purdom is a 55 year old who presents with chest pain.  He has diabetes.  He also smokes.  He has had some recurrent episodes.  His enzymes are negative.  The electrocardiogram is nondiagnostic and  was last read as within normal limits.  The current study was done to  assess coronary anatomy.   PROCEDURES:  1. Left heart catheterization.  2. Selective coronary arteriography.  3. Selective left ventriculography.   DESCRIPTION OF THE PROCEDURE:  The patient was brought to the  catheterization laboratory and prepped and draped in the usual fashion.  The patient has been on enoxaparin, approximately 8-9 hours earlier.  Through an anterior puncture, the femoral artery was easily entered.  A  5-French sheath was placed.  Views of the left and right coronary  arteries were then obtained.  Central aortic and left ventricular  pressures were measured with pigtail catheter.  Ventriculography was  performed in the RAO projection.  There were no major complications.  He  was taken to the holding area in satisfactory clinical condition for  sheath removal.   HEMODYNAMIC DATA:  1. The central aortic pressure was 106/76, mean 91.  2. Left ventricular pressure 123/7.  3. There was no gradient or pullback across the aortic valve.   ANGIOGRAPHIC DATA:  1. Ventriculography was done in the RAO projection.  Overall systolic      function was preserved.  Ejection fraction would be estimated in      the range of approximately 60%.  2. The right coronary artery is a somewhat tortuous vessel.  There is      minimal luminal irregularity after  the posterior descending and      first posterolateral branch.  There is probably an area of about      30% eccentric plaquing in the AV groove.  This does not appear to      be high grade.  Flow into the remaining posterolateral system is      adequate.  3. The left main is free of critical disease.  4. The left anterior descending artery demonstrates some aneurysmal      ectasia.  There is some minor contrast hang up, which clears and      this is likely due to the vessel expansion; however, no critical      stenosis is noted.  The LAD wraps the apex and is free of critical      disease.  5. The circumflex is a moderate-sized vessel providing a major      marginal system.  No critical stenosis is noted.   CONCLUSIONS:  1. Well-preserved overall left ventricular systolic function.  2. Minimal irregularity with about 30% narrowing in  the distal portion      of the right coronary artery as noted above.  3. Aneurysmal dilatation of the proximal LAD with slight contrast hang      up, but no contrast staining and excellent runoff without      significant focal obstruction.   DISPOSITION:  Enoxaparin will be discontinued.  Other sources of chest  pain will be identified.      Arturo Morton. Riley Kill, MD, [facility]  Electronically Signed     TDS/MEDQ  D:  02/24/2008  T:  02/25/2008  Job:  [id number]   cc:   Osterhout Ruths, M.D.  CV Laboratory

## 2010-07-05 NOTE — H&P (Signed)
NAME:  Travis Phillips, HARK NO.:  [id number]   MEDICAL RECORD NO.:  [id number]          PATIENT TYPE:  AMB   LOCATION:  DAY                           FACILITY:  [facility]   PHYSICIAN:  Ky Barban, M.D.DATE OF BIRTH:  Jan 01, 1956   DATE OF ADMISSION:  DATE OF DISCHARGE:  LH                              HISTORY & PHYSICAL   CHIEF COMPLAINT:  Backache worse on the right side and the right flank.   HISTORY:  A 55 year old gentleman came to see me because he is having  pain more so on the right flank.  No nausea, vomiting, fever, or chills.  Last year, he had similar pain.  He was found to have a large stone in  the right upper ureter.  He had a double-J stent and ESL.  He passed  that stone.  He still has bilateral renal calculi as I have seen on  recent CT scan and also KUB.  There is a stone at least 12 mm in size in  the right kidney causing no obstruction.  I have advised him to undergo  ESL for which he is coming as an outpatient.  I am not going to put any  double-J stent, treat him without the stent and we will see what  happens.  I have told him need of additional procedure, possible  complication, and he understands.   PAST HISTORY:  As I said he had ESL done for right renal calculus last  year.  He has non-insulin-dependent diabetes.   PERSONAL HISTORY:  Does not smoke or drink.   REVIEW OF SYSTEMS:  Otherwise unremarkable.   PHYSICAL EXAMINATION:  VITAL SIGNS:  Blood pressure 140/80 and  temperature is normal.  CENTRAL NERVOUS SYSTEM:  Negative.  HEAD, NECK, EYE, AND ENT:  Negative.  CHEST:  Symmetrical.  HEART:  Regular sinus rhythm, no murmur.  ABDOMEN:  Soft and flat.  Liver, spleen, and kidneys are not palpable.  No CVA tenderness  EXTERNAL GENITALIA:  Unremarkable.   IMPRESSION:  Bilateral renal calculi.   PLAN:  ESL for right renal calculus as an outpatient.      Ky Barban, M.D.  Electronically Signed    MIJ/MEDQ  D:   01/07/2008  T:  01/08/2008  Job:  [id number]

## 2010-07-05 NOTE — Op Note (Signed)
NAME:  Travis Phillips, Travis Phillips NO.:  [id number]   MEDICAL RECORD NO.:  [id number]          PATIENT TYPE:  AMB   LOCATION:  DAY                           FACILITY:  [facility]   PHYSICIAN:  Kassie Mends, M.D.      DATE OF BIRTH:  Dec 29, 1955   DATE OF PROCEDURE:  10/16/2008  DATE OF DISCHARGE:                                PROCEDURE NOTE   REFERRING Dejane Scheibe:  Zayas Ruths, MD   PROCEDURE:  Esophagogastroduodenoscopy with cold forceps biopsy of the  gastric and duodenal mucosa.   INDICATION FOR EXAM:  Travis Phillips is a 55 year old male who presents with  abdominal pain and diarrhea.  His last colonoscopy in 2007, showed small  internal hemorrhoids and mild diverticulosis.  He is maintained on  metformin 500 mg twice a day.  He takes Aciphex twice a day.  He used to  use BC powders and uses [employer] headache pills and is unknown if they  contain aspirin.   FINDINGS:  1. Normal esophagus without evidence of Barrett, mass, erosion,      ulceration or stricture.  2. Patchy erythema with erosions in the antrum.  Biopsies obtained via      cold forceps to evaluate for H. pylori gastritis.  3. Multiple frequent patches of erythema seen in the duodenal bulb and      the proximal portion of the second portion of the duodenum.      Biopsies obtained via cold forceps in the distal second portion of      the duodenum to evaluate for celiac sprue as an etiology for his      abdominal pain and diarrhea.   DIAGNOSES:  1. No obvious source for diarrhea identified.  2. Abdominal pain may be related to gastritis.   RECOMMENDATIONS:  1. Will call Travis Phillips with the results of his biopsies.  2. He should follow a lactose-free diet.  He is given a handout on      lactose-free diet.  3. No aspirin or NSAIDs for 14 days.  No anticoagulation for 7 days.  4. Consider hydrogen breath test for small bowel bacterial overgrowth      if no source for diarrhea can be identified.   MEDICATIONS:  1. Demerol 75 mg IV.  2. Versed 5 mg IV.   PROCEDURE TECHNIQUE:  Physical exam was performed.  Informed consent was  obtained from the patient after explaining the benefits, risks and  alternatives to the procedure.  The patient was connected to the monitor  and placed in left lateral position.  Continuous oxygen was provided by  nasal cannula, IV medicine administered through an indwelling cannula.  After administration of sedation, the patient's esophagus was intubated  and the scope was advanced under direct visualization to the second  portion of the duodenum.  The scope was removed slowly by carefully  examining the color, texture, anatomy and integrity of the mucosa on the  way out.  The patient was recovered in endoscopy and discharged home in  satisfactory condition.   LABORATORY FINDINGS:  From July 2010,  white count 8.1, hemoglobin 14.7,  platelets 238, BUN 15, creatinine 1.21, lipase 18, TSH 0.714, alk phos  65, AST 9, ALT less than 8, albumin 4.3, total bilirubin 0.4.   PATH:  Mild chronic gastritis. Normal duodenum.      Kassie Mends, M.D.  Electronically Signed     SM/MEDQ  D:  10/16/2008  T:  10/16/2008  Job:  [id number]   cc:   Learta Codding, MD,FACC  [address]  [place], [place] [address]   Luevanos Ruths, M.D.  Fax: [phone]

## 2010-07-05 NOTE — Assessment & Plan Note (Signed)
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  The patient  received an extensive evaluation for this.  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  He was seen by neurology at that time.  The  patient has had a history of chest pain but recently underwent cardiac  catheterization which was negative for any significant obstructive  coronary artery disease.  Unfortunately, the patient continues to smoke.  In the past also, the patient had complaints of tachyarrhythmias and  pain.  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  During his current visit, however, he reports no recurrent  palpitations.   The patient now presents essentially as a post catheterization follow  up.  He was recently seen at [facility] with chest pain,  transferred to [facility], and a catheterization was performed.  However, the patient's main problem appear a significant amount of  weight loss.  Actually, the patient now only weighs 173 pounds, but the  last time I saw him he weighed 225 pounds.  He describes his weight due  to the fact that he has stopped snacking, but he also reports that his  appetite has markedly decreased and really does not feel like eating  much.  He also has significant constipation which has been going on for  the past 3 months.  He states when he does make a bowel movement, it is  rough.  In addition, he complains of feeling cold all the time.  The  patient is here partly because of post groin check, but partly  also  because he has bilateral lower back tenderness which is attributed to  nephrolithiasis.  I understand Dr. Jerre Simon is going to do a lithotripsy.  He is here for cardiovascular clearance.   In reviewing the patient's chart, however, he also has a history of a  thyroid nodule.  This was palpated and was quite tender on exam today.  Prior CT of the abdomen also had mentioned multiple cysts of the liver.  These were consistent with hemangioma or simple cysts, but follow up was  required.   MEDICATIONS:  1. Metformin ER 500 mg p.o. b.i.d.  2. Lisinopril.  Not clear if the patient is taking this, but it does      not appear that he is taking metoprolol anymore.   PHYSICAL EXAMINATION:  VITAL SIGNS:  Blood pressure 143/94, heart rate  119 beats per minute, weight 173 pounds.  GENERAL:  Somewhat cachectic-appearing white male with clearly  significant weight loss since last visit.  HEENT:  Pupils are equal.  Conjunctivae clear.  NECK:  A tender  left thyroid lobe with a clearly palpable nodule which  is approximately 2 cm.  It is very tender on palpation.  I could not  feel any cervical or supraclavicular adenopathy.  LUNGS:  The lungs are clear bilaterally.  HEART:  Regular rate and rhythm with normal S1 and S2.  No murmur, rubs  or gallops.  ABDOMEN:  Soft, nontender, and there appears to be no  hepatosplenomegaly.  The patient does have bilateral CVA tenderness even  on gentle pressure.  He is complaining of significant pain.   PROBLEM LIST:  1. Nephrolithiasis (documented by renal ultrasound).  2. Thyroid nodule with peripheral calcification previously documented      but now tender.  3. Documented liver lesions, hemangioma versus cysts.  4. History of nonobstructive coronary artery disease.  5. Diabetes mellitus.  6. 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  7. 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  8. Weight loss.  9.  Constipation.  10.Cold intolerance.   PLAN:  1. From a cardiac standpoint, the patient appears to be stable.      Certainly, from the perspective of lithotripsy for further therapy      for kidney stones, I do not see any contraindications.  However, I      do question whether the patient's CVA tenderness is related to his      kidney stones.  2. Given his quite abnormal CT scan previously with the details      documented as above, we will repeat a CT scan of the abdomen with      contrast.  3. The patient will also be referred for a thyroid ultrasound to      follow up on the thyroid nodule.  I do feel he likely needs a fine-      needle aspiration to rule out thyroid cancer.  4. Will also restart the patient's metoprolol 75 mg p.o. daily given      his high diastolic blood pressure and elevated heart rate.  5. The patient will follow up after diagnostic testing, and I also      encouraged him to stop smoking.     Learta Codding, MD,FACC  Electronically Signed    GED/MedQ  DD: 03/16/2008  DT: 03/16/2008  Job #: [id number]   cc:   Barner Ruths, M.D.

## 2010-07-05 NOTE — H&P (Signed)
NAME:  GERADO, NABERS NO.:  [id number]   MEDICAL RECORD NO.:  [id number]          PATIENT TYPE:  INP   LOCATION:  3733                         FACILITY:  [facility]   PHYSICIAN:  Therisa Doyne, MD    DATE OF BIRTH:  1955/11/02   DATE OF ADMISSION:  02/24/2008  DATE OF DISCHARGE:  02/24/2008                              HISTORY & PHYSICAL   PRIMARY CARE Kimesha Claxton:  Constantino Ruths, M.D., [place], [place].   PRIMARY CARDIOLOGIST:  Learta Codding, MD,FACC, [place].   CHIEF COMPLAINT:  Chest pain.   HISTORY OF PRESENT ILLNESS:  A 55 year old white male with a past  medical history significant for nonobstructive coronary artery disease  by cardiac catheterization approximately 6 years ago, diabetes,  hypertension, hyperlipidemia, and tobacco abuse.  He presents for  evaluation of chest tightness.  The patient felt chest tightness on  Wednesday evening associated with shortness of breath; this lasted  approximately 1 hour and was relieved on its own.  This evening, on  Sunday, he developed similar symptoms of chest tightness.  This time he  had associated left arm numbness and was described as a dead weight.  It was associated with shortness of breath  and nausea.  He went to the  emergency department where it was relieved with 3 sublingual  nitroglycerin.  Currently he denies any chest tightness or chest  pressure.  At the outside hospital he was given aspirin, sublingual  nitroglycerin and oral Lopressor.   The patient denies any prior exertional symptoms of chest pain.  He does  not exercise routinely, but does state that he does housework which  includes vacuuming.  He states that he can do this without having any  difficulties.  He denies any heart failure symptoms, lower extremity  edema, PND or orthopnea.   PAST MEDICAL HISTORY:  1. Nephrolithiasis:  The patient has had recurrent problems with      nephrolithiasis.  Most recently he had a failed  lithotripsy in      October 2009.  2. Hypertension.  3. Hyperlipidemia.  4. Diabetes mellitus with possible diabetic neuropathy.  5. Nonobstructive coronary artery disease by cardiac catheterization.      The family reports that he had a catheterization approximately 6      years ago in [place], [place]; this showed minimal nonobstructive      disease, per the family's report.  6. Osteoarthritis.  7. Status post removal of spinal tumor in 1991.  8. Status post MRI and MRA of the brain.  9. History of angiography, which was negative for subarachnoid      hemorrhage or cerebral aneurysm.   SOCIAL HISTORY:  The patient lives at home with his girlfriend.  He  currently continues to smoke tobacco; has smoked one pack per day for 35  years.  He denies any alcohol or drugs.   FAMILY HISTORY:  No history of early coronary disease.   REVIEW OF SYSTEMS:  All systems reviewed and are negative, except as  mentioned above in the history of present illness.  ALLERGIES:  NO KNOWN DRUG ALLERGIES.   MEDICATIONS:  1. Metformin 500 mg b.i.d.  2. Lopressor 50 mg b.i.d.  3. Percocet p.r.n..   PHYSICAL EXAMINATION:  VITAL SIGNS: Temperature afebrile.  Blood  pressure 141/86, pulse 90, respirations 18, oxygen saturation 98% on  room air.  GENERAL:  No acute distress.  HEENT: Normocephalic and atraumatic.  Pupils equal round and react to  light and accommodation.  Extraocular movements are intact. Oropharynx  pink and moist without any lesions.  NECK:  Supple; no lymphadenopathy and no jugular venous distention.  CARDIOVASCULAR:  Regular rate and rhythm.  No murmurs, rubs or gallops.  CHEST:  Clear to auscultation bilaterally.  ABDOMEN:  Positive bowel sounds.  Soft, nontender and nondistended.  EXTREMITIES: No clubbing, cyanosis or edema.  Dorsalis pedis pulses 2+  bilaterally.  Radial pulses 2+ bilaterally.  Femoral pulses 2+  bilaterally without evidence of bruits.   DATA:  1. EKG from  the outside hospital showed normal sinus rhythm with 101      beats per minute; normal axis, no intervals, and no ST or T wave      changes.  No active ischemia.  2. EKG from [facility] today at 5:33 shows normal sinus      rhythm at 82 beats per minute, with no active ischemic changes.  3. Chest x-ray showed no acute cardiopulmonary disease.   PERTINENT LABORATORY DATA:  BUN 14, creatinine 1. Two sets of troponins  are less than 0.005.   ASSESSMENT AND PLAN:  A 55 year old white male with a past medical  history significant for nonobstructive disease by prior cardiac  catheterization, hypertension, hyperlipidemia and diabetes mellitus.  He  presents with 2 episodes of chest tightness.  He has a normal EKG and  negative cardiac biomarkers.   1. Will admit the patient to the cardiology service under Dr. Margarita Mail      care to a telemetry unit.   1. Chest pain:  Certainly this could be consistent with unstable      angina; however, his normal EKG and normal enzymes make me think      this is less likely cardiac.  However, he does have risk factors      for coronary artery disease, being that he has diabetes,      hypertension and hyperlipidemia.  We will medically treat the      patient with aspirin 325 mg daily, Lopressor 50 mg b.i.d., and      start the patient on Lipitor 80 mg daily.  At this time we will not      use a 2B3A inhibitor or systemic anticoagulation, unless the      patient rules in for myocardial infarction or has objective      evidence of myocardial ischemia.  We will rule the patient out for      myocardial infarction with serial cardiac enzymes.  If the patient      rules out, consider stress testing for risk stratification.  Will      monitor the patient on telemetry.   1. Hypertension:  Appears to be well-controlled.  Continue Lopressor.      He does have diabetes and it may be prudent to start the patient on      an ACE inhibitor at discharge.   1.  Diabetes mellitus:  Hold metformin, as the patient may undergo      cardiac catheterization.  Check hemoglobin A1c.  Place the patient  on sliding-scale insulin.   1. Fluids, electrolytes, nutrition:  Saline and IV fluids.      Electrolytes are stable.  Keep n.p.o.   1. Deep vein thrombosis prophylaxis:  Will start the patient on      prophylactic Lovenox.      Therisa Doyne, MD  Electronically Signed     SJT/MEDQ  D:  02/24/2008  T:  02/24/2008  Job:  [id number]

## 2010-07-05 NOTE — Discharge Summary (Signed)
NAME:  Travis Phillips, Travis Phillips NO.:  [id number]   MEDICAL RECORD NO.:  [id number]          PATIENT TYPE:  INP   LOCATION:  3733                         FACILITY:  [facility]   PHYSICIAN:  Luis Abed, MD, FACCDATE OF BIRTH:  June 06, 1955   DATE OF ADMISSION:  02/24/2008  DATE OF DISCHARGE:  02/25/2008                               DISCHARGE SUMMARY   PROCEDURES:  1. Cardiac catheterization.  2. Coronary arteriogram.  3. Left ventriculogram.   PRIMARY FINAL DISCHARGE DIAGNOSIS:  Chest pain, noncritical coronary  artery disease at catheterization and medical therapy recommended.   SECONDARY DIAGNOSES:  1. Hypertension.  2. Hyperlipidemia with a total cholesterol of 215, triglycerides 194,      HDL 29, and LDL 147, statin therapy initiated this admission.  3. Diabetes with a hemoglobin A1c of 5.7, this admission.  4. Nephrolithiasis.  5. Questionable neuropathy.  6. Osteoarthritis.  7. History of syncope with MRI and MRA as well as angiography, which      was negative for subarachnoid hemorrhage or cerebral aneurysm.  8. Status post removal of the spinal tumor in 1991.   TIME AT DISCHARGE:  31 minutes.   HOSPITAL COURSE:  Travis Phillips is a 55 year old male with multiple cardiac  risk factors who had chest pain on the day of admission.  He came to the  hospital and was admitted for further evaluation.   His chest x-ray showed no acute disease.  The D-dimer was checked and  was within normal limits.  His initial cardiac enzymes were negative.  Because of his multiple cardiac risk factors and symptoms concerning for  unstable anginal pain, he was taken to the cath lab.   The cardiac catheterization showed aneurysmal dilatation, but no ectasia  of the LAD.  He had a 30% stenosis in the distal TDA.  His EF was 60%  with no wall motion abnormalities.   On February 25, 2008, his chest pain had resolved.  His postprocedure labs  were within normal limits.  Dr. Myrtis Ser evaluated Mr.  Phillips and felt that he  had noncardiac chest pain.  Travis Phillips was considered stable for discharge  with outpatient followup in [place].   DISCHARGE INSTRUCTIONS:  His activity level is to be increased gradually  with no driving for 2 days and no lifting for a week.  He is to stick to  a low-fat diabetic diet.  He is to call our office for problems with the  cath site.  He is to call Dr. Andee Lineman in [place] on March 10, 2008, at  2:30 with Dr. Regino Schultze as needed.   DISCHARGE MEDICATIONS:  1. Metformin 500 mg b.i.d., hold till February 27, 2008.  2. Metoprolol 50 mg b.i.d.  3. Aspirin 81 mg a day.  4. Percocet as prior to admission.  5. Zocor 40 mg a day.      Theodore Demark, PA-C      Luis Abed, MD, [facility]  Electronically Signed    RB/MEDQ  D:  02/25/2008  T:  02/26/2008  Job:  [id number]   cc:  Cisek Ruths, M.D.  [facility]

## 2010-07-08 NOTE — Consult Note (Signed)
NAME:  Travis Phillips, Travis Phillips NO.:  [id number]   MEDICAL RECORD NO.:  [id number]          PATIENT TYPE:  INP   LOCATION:  4732                         FACILITY:  [facility]   PHYSICIAN:  Melvyn Novas, M.D.  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  He saw,  about 3 weeks ago, Dr. Georgina Peer in the [facility] office.  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  Dr. Azucena Fallen had a recommended to add an MRA which had not  been performed yet.  The patient now presented to the [facility] with a syncopal episode which occurred on May 07, 2006.  The  patient had been in relatively good health.  Again, his hypertension and  diabetes were well controlled he had no recent febrile illness, has no  history of any neurodegenerative diseases, no falls or trauma, not an  extensive past surgical history.  No diarrhea, dehydration, vomiting,  urinary incontinence.  He endorsed however loud snoring and has by  aspect a very large neck circumference.  The patient said that there was  no warning symptoms the moment he had his syncopal episodes.  He did  neither feel hot nor cold.  He did not have pulsations.  No visual  auras.  He just went down in the driveway walking towards his truck.  He was seen by two colleagues on the ground but nobody seemed to have  witnessed the event how he got there.  While the patient tells me that  his colleagues called his wife to pick him up, his wife states that he  himself called her by cell phone.  The patient was evaluated in the  [facility]  ER, had a CT scan of the head which was unremarkable, had a  very mild UTI and elevated white blood cell count, but was afebrile.  He  was slightly groggy and his wife states that he was partially confused  for 10-12 minutes after the event.  The duration of his blackout spell  was estimated to be about 2 minutes.   The patient has been having headaches for the last 2-3 months which were  supposedly related to his hemosiderosis.  He also has dizziness and  increased hearing loss on the left side and an inner ear problem was  considered in the differential diagnosis.  He has so far not seen ENT.  He has still not had his MRA.  He is otherwise doing well and at this  time denies again nausea, vomiting, vision changes, dysphagia, chest  pain, palpitations, shortness of breath, wheezing, cough, abdominal  pain, pain with urination, constipation, diarrhea, focal extremity  weakness, numbness, dysmetria, or skin changes.  The patient had a cervical spinal tumor removed; I have no further  evidence if this was a benign finding.  He has diabetes type 2 which is  medication controlled, borderline hypertension, hyperlipidemia.   He is currently taking metformin, metoprolol, Zocor.  He says that he  frequently takes aspirin in powder form.  He also takes tetracycline for  a skin condition, rosacea.   ALLERGIES:  No known drug allergies.   SOCIAL HISTORY:  The patient is an ex-smoker.  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  He  is full-time, gainfully employed.  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  He often  drives 10-12 hours.   FAMILY HISTORY:  Noncontributory.   PHYSICAL EXAMINATION:  HEART:  The patient had a heart rate now of 88,  but upon admission he was found to be tachycardiac at 112.  He does have  some gallops; I did not hear a regular rate and rhythm.  There is no  longer tachycardia, however.  LUNGS:  Clear to auscultation.  ABDOMEN:   Soft, nondistended, but obese.  EXTREMITIES:  No clubbing, cyanosis, edema, bruising, or rash.  NEUROLOGIC:  Again, there were no focal neurologic findings.  Cranial  nerve examination shows pupils reacting equally to light and  accommodation.  No facial droop.  No facial sensory loss.  Tongue is  midline.  Uvula was not visible; the patient has a high-grade  Mallampati.  He has no temporal artery induration or lymph node swelling  or neck vein distension.  Ophthalmoscopic examination showed no  abnormalities in the retina.  Finger-to-nose test without dysmetria,  tremor, or akinesia.  Gait:  The patient seems to be walking slower than  I would expect from his peer group but then he is still slightly  lightheaded.  He has no focal weakness.   ASSESSMENT:  Syncopal event.  I have no neurologic explanation for this.  I would recommend to move ahead and instead of doing  the MRA  outpatient, do it inpatient before the patient goes home.  He might need  to be discharged on a cardiac monitor that he can wear for a prolonged  period of time to mimic his normal daily life situations which could  bring ventricular tachycardia on.  He was also warned to keep hydrated  and to eat regular meals to control his blood sugar.  He is already  treated with Cipro for mild urinary tract infection.   The patient will follow up outpatient with Dr. Azucena Fallen within the next  3-4 weeks, call [phone].      Melvyn Novas, M.D.  Electronically Signed     CD/MEDQ  D:  05/08/2006  T:  05/09/2006  Job:  [id number]

## 2010-08-01 ENCOUNTER — Other Ambulatory Visit (HOSPITAL_COMMUNITY): Payer: Self-pay | Admitting: Family Medicine

## 2010-08-01 DIAGNOSIS — E785 Hyperlipidemia, unspecified: Secondary | ICD-10-CM

## 2010-08-01 DIAGNOSIS — I10 Essential (primary) hypertension: Secondary | ICD-10-CM

## 2010-08-01 DIAGNOSIS — R609 Edema, unspecified: Secondary | ICD-10-CM

## 2010-08-03 ENCOUNTER — Other Ambulatory Visit (HOSPITAL_COMMUNITY): Payer: Self-pay | Admitting: Family Medicine

## 2010-08-03 ENCOUNTER — Ambulatory Visit (HOSPITAL_COMMUNITY)
Admission: RE | Admit: 2010-08-03 | Discharge: 2010-08-03 | Disposition: A | Discharge status: Home / Self Care | Payer: Medicaid Other | Source: Ambulatory Visit | Attending: Family Medicine | Admitting: Family Medicine

## 2010-08-03 DIAGNOSIS — E785 Hyperlipidemia, unspecified: Secondary | ICD-10-CM

## 2010-08-03 DIAGNOSIS — I10 Essential (primary) hypertension: Secondary | ICD-10-CM

## 2010-08-03 DIAGNOSIS — R609 Edema, unspecified: Secondary | ICD-10-CM

## 2010-08-03 DIAGNOSIS — M7989 Other specified soft tissue disorders: Secondary | ICD-10-CM | POA: Insufficient documentation

## 2010-08-03 DIAGNOSIS — R209 Unspecified disturbances of skin sensation: Secondary | ICD-10-CM | POA: Insufficient documentation

## 2010-11-17 DIAGNOSIS — I10 Essential (primary) hypertension: Secondary | ICD-10-CM | POA: Insufficient documentation

## 2010-11-17 DIAGNOSIS — K219 Gastro-esophageal reflux disease without esophagitis: Secondary | ICD-10-CM | POA: Insufficient documentation

## 2010-11-17 DIAGNOSIS — R209 Unspecified disturbances of skin sensation: Secondary | ICD-10-CM | POA: Insufficient documentation

## 2010-11-17 DIAGNOSIS — R51 Headache: Secondary | ICD-10-CM | POA: Insufficient documentation

## 2010-11-17 DIAGNOSIS — Z8673 Personal history of transient ischemic attack (TIA), and cerebral infarction without residual deficits: Secondary | ICD-10-CM | POA: Insufficient documentation

## 2010-11-17 DIAGNOSIS — E7800 Pure hypercholesterolemia, unspecified: Secondary | ICD-10-CM | POA: Insufficient documentation

## 2010-11-17 NOTE — ED Notes (Signed)
Left side of body numb, severe headache to right sid eof head, all started 30 min opta

## 2010-11-18 ENCOUNTER — Emergency Department (HOSPITAL_COMMUNITY): Payer: Medicaid Other

## 2010-11-18 ENCOUNTER — Other Ambulatory Visit: Payer: Self-pay

## 2010-11-18 ENCOUNTER — Encounter (HOSPITAL_COMMUNITY): Payer: Self-pay

## 2010-11-18 ENCOUNTER — Emergency Department (HOSPITAL_COMMUNITY)
Admission: EM | Admit: 2010-11-18 | Discharge: 2010-11-18 | Disposition: A | Discharge status: Home / Self Care | Payer: Medicaid Other | Attending: Emergency Medicine | Admitting: Emergency Medicine

## 2010-11-18 DIAGNOSIS — R200 Anesthesia of skin: Secondary | ICD-10-CM

## 2010-11-18 DIAGNOSIS — R51 Headache: Secondary | ICD-10-CM

## 2010-11-18 HISTORY — DX: Dizziness and giddiness: R42

## 2010-11-18 HISTORY — DX: Headache: R51

## 2010-11-18 HISTORY — DX: Essential (primary) hypertension: I10

## 2010-11-18 HISTORY — DX: Gastro-esophageal reflux disease without esophagitis: K21.9

## 2010-11-18 HISTORY — DX: Cramp and spasm: R25.2

## 2010-11-18 HISTORY — DX: Pure hypercholesterolemia, unspecified: E78.00

## 2010-11-18 LAB — CBC
MCHC: 34.1 g/dL (ref 30.0–36.0)
Platelets: 227 10*3/uL (ref 150–400)
RDW: 12.9 % (ref 11.5–15.5)

## 2010-11-18 LAB — COMPREHENSIVE METABOLIC PANEL
ALT: 15 U/L (ref 0–53)
Albumin: 3.8 g/dL (ref 3.5–5.2)
Alkaline Phosphatase: 109 U/L (ref 39–117)
Calcium: 10.7 mg/dL — ABNORMAL HIGH (ref 8.4–10.5)
Potassium: 3.9 mEq/L (ref 3.5–5.1)
Sodium: 137 mEq/L (ref 135–145)
Total Protein: 6.7 g/dL (ref 6.0–8.3)

## 2010-11-18 LAB — PROTIME-INR: INR: 0.89 (ref 0.00–1.49)

## 2010-11-18 MED ORDER — KETOROLAC TROMETHAMINE 30 MG/ML IJ SOLN
INTRAMUSCULAR | Status: AC
  Administered 2010-11-18: 30 mg
  Filled 2010-11-18: qty 1

## 2010-11-18 MED ORDER — MORPHINE SULFATE 4 MG/ML IJ SOLN
4.0000 mg | Freq: Once | INTRAMUSCULAR | Status: AC
  Administered 2010-11-18: 4 mg via INTRAVENOUS
  Filled 2010-11-18: qty 1

## 2010-11-18 NOTE — ED Provider Notes (Addendum)
History     CSN: [id number] Arrival date & time: 11/18/2010 12:02 AM  Chief Complaint  Patient presents with  . Numbness    left side of face numbnes and down left side, right side headache    (Consider location/radiation/quality/duration/timing/severity/associated sxs/prior treatment) HPI Comments: Patient has history of "spells" where he becomes numb, headache just like this.  Was told is stroke by some doctors, that it isn't by others.  Has physician at [facility] that he sees for this.  Tonight another episode occurred, causing the same numbness in his left hand and face, not leg.  Getting better now.  No other complaints.  No aggravating or alleviating factors.  The history is provided by the patient and a friend.    Past Medical History  Diagnosis Date  . Stroke   . Hypertension   . Dizziness   . Headache   . Spasticity   . Hypercholesteremia   . Acid reflux     Past Surgical History  Procedure Date  . Spinal tumor    . Hernia repair     No family history on file.  History  Substance Use Topics  . Smoking status: Never Smoker   . Smokeless tobacco: Not on file  . Alcohol Use: No      Review of Systems  Neurological: Positive for numbness.  All other systems reviewed and are negative.    Allergies  Review of patient's allergies indicates no known allergies.  Home Medications   Current Outpatient Rx  Name Route Sig Dispense Refill  . AMITRIPTYLINE HCL 10 MG PO TABS Oral Take 10 mg by mouth 2 (two) times daily. One in am and one in pm     . BACLOFEN 10 MG PO TABS Oral Take 5 mg by mouth 3 (three) times daily.      Marland Kitchen MECLIZINE HCL 12.5 MG PO TABS Oral Take 12.5 mg by mouth 3 (three) times daily as needed.      Marland Kitchen METOPROLOL TARTRATE 50 MG PO TABS Oral Take 50 mg by mouth 2 (two) times daily.      Marland Kitchen OMEPRAZOLE 20 MG PO CPDR Oral Take 20 mg by mouth 2 (two) times daily.      Marland Kitchen PRAVASTATIN SODIUM 40 MG PO TABS Oral Take 40 mg by mouth daily.        BP  167/106  Pulse 119  Temp(Src) 98.1 F (36.7 C) (Oral)  Resp 22  Ht 6' (1.829 m)  Wt 195 lb (88.451 kg)  BMI 26.45 kg/m2  SpO2 93%  Physical Exam  Constitutional: He is oriented to person, place, and time. He appears well-developed and well-nourished.  HENT:  Head: Normocephalic and atraumatic.  Right Ear: External ear normal.  Left Ear: External ear normal.  Mouth/Throat: Oropharynx is clear and moist.  Eyes: EOM are normal. Pupils are equal, round, and reactive to light.  Neck: Normal range of motion. Neck supple.  Cardiovascular: Normal rate and regular rhythm.   Pulmonary/Chest: Effort normal and breath sounds normal. No respiratory distress.  Abdominal: Soft. Bowel sounds are normal. He exhibits no distension. There is no tenderness.  Musculoskeletal: Normal range of motion.  Neurological: He is alert and oriented to person, place, and time. No cranial nerve deficit. Coordination normal.       Very hoh.    ED Course  Procedures (including critical care time)   Labs Reviewed  CBC  COMPREHENSIVE METABOLIC PANEL  APTT  PROTIME-INR   No results found.  No diagnosis found.  EKG shows st@114  bpm.  MDM  Workup unremarkable.  Symptoms have resolved.  This is similar to prior episodes and I believe patient is safe for discharge.  Needs to follow up with his neurologist.        Geoffery Lyons, MD 11/18/10 1610    Geoffery Lyons, MD 11/18/10 [phone]

## 2010-11-18 NOTE — ED Notes (Signed)
Pt has superficial siderosis, unable to add to history .

## 2010-11-22 LAB — CBC
HCT: 43.7
Hemoglobin: 14.8
MCHC: 34
MCV: 85.8
Platelets: 227
RBC: 5.1
RDW: 13.1
WBC: 11.1 — ABNORMAL HIGH

## 2010-11-22 LAB — URINE MICROSCOPIC-ADD ON

## 2010-11-22 LAB — DIFFERENTIAL
Basophils Absolute: 0
Basophils Relative: 0
Eosinophils Absolute: 0.1
Eosinophils Relative: 1
Lymphocytes Relative: 15
Lymphs Abs: 1.6
Monocytes Absolute: 0.2
Monocytes Relative: 2 — ABNORMAL LOW
Neutro Abs: 9.3 — ABNORMAL HIGH
Neutrophils Relative %: 84 — ABNORMAL HIGH

## 2010-11-22 LAB — URINE CULTURE
Colony Count: NO GROWTH
Culture: NO GROWTH

## 2010-11-22 LAB — URINALYSIS, ROUTINE W REFLEX MICROSCOPIC
Glucose, UA: NEGATIVE
Specific Gravity, Urine: 1.02
pH: 7

## 2010-11-22 LAB — GLUCOSE, CAPILLARY: Glucose-Capillary: 128 — ABNORMAL HIGH

## 2010-11-22 LAB — BASIC METABOLIC PANEL WITH GFR
CO2: 26
Calcium: 9.5
Chloride: 110
Creatinine, Ser: 1.01
GFR calc Af Amer: >60
Glucose, Bld: 114 — ABNORMAL HIGH
Sodium: 140

## 2010-11-22 LAB — BASIC METABOLIC PANEL
BUN: 13
GFR calc non Af Amer: >60
Potassium: 3.9

## 2011-02-08 ENCOUNTER — Telehealth: Payer: Self-pay | Admitting: Cardiology

## 2011-02-08 NOTE — Telephone Encounter (Signed)
Reminder mailed in June for July 2012 °Reminder mailed again 01/17/11-srs °Called patient 12/19-srs °

## 2011-04-07 ENCOUNTER — Emergency Department (HOSPITAL_COMMUNITY): Payer: No Typology Code available for payment source

## 2011-04-07 ENCOUNTER — Encounter (HOSPITAL_COMMUNITY): Payer: Self-pay

## 2011-04-07 ENCOUNTER — Emergency Department (HOSPITAL_COMMUNITY)
Admission: EM | Admit: 2011-04-07 | Discharge: 2011-04-07 | Disposition: A | Discharge status: Home / Self Care | Payer: No Typology Code available for payment source | Attending: Emergency Medicine | Admitting: Emergency Medicine

## 2011-04-07 DIAGNOSIS — S0001XA Abrasion of scalp, initial encounter: Secondary | ICD-10-CM

## 2011-04-07 DIAGNOSIS — W010XXA Fall on same level from slipping, tripping and stumbling without subsequent striking against object, initial encounter: Secondary | ICD-10-CM | POA: Insufficient documentation

## 2011-04-07 DIAGNOSIS — M79609 Pain in unspecified limb: Secondary | ICD-10-CM | POA: Insufficient documentation

## 2011-04-07 DIAGNOSIS — H9190 Unspecified hearing loss, unspecified ear: Secondary | ICD-10-CM | POA: Insufficient documentation

## 2011-04-07 DIAGNOSIS — S0990XA Unspecified injury of head, initial encounter: Secondary | ICD-10-CM | POA: Insufficient documentation

## 2011-04-07 DIAGNOSIS — S1093XA Contusion of unspecified part of neck, initial encounter: Secondary | ICD-10-CM | POA: Insufficient documentation

## 2011-04-07 DIAGNOSIS — IMO0002 Reserved for concepts with insufficient information to code with codable children: Secondary | ICD-10-CM | POA: Insufficient documentation

## 2011-04-07 DIAGNOSIS — Z7982 Long term (current) use of aspirin: Secondary | ICD-10-CM | POA: Insufficient documentation

## 2011-04-07 DIAGNOSIS — S0003XA Contusion of scalp, initial encounter: Secondary | ICD-10-CM | POA: Insufficient documentation

## 2011-04-07 DIAGNOSIS — W19XXXA Unspecified fall, initial encounter: Secondary | ICD-10-CM

## 2011-04-07 DIAGNOSIS — M5030 Other cervical disc degeneration, unspecified cervical region: Secondary | ICD-10-CM | POA: Insufficient documentation

## 2011-04-07 DIAGNOSIS — I10 Essential (primary) hypertension: Secondary | ICD-10-CM | POA: Insufficient documentation

## 2011-04-07 DIAGNOSIS — E7800 Pure hypercholesterolemia, unspecified: Secondary | ICD-10-CM | POA: Insufficient documentation

## 2011-04-07 NOTE — Discharge Instructions (Signed)
As we discussed, the x-rays today showed no signs of concerning injury. Return to the ER if needed for any new or concerning symptoms.   Abrasions Abrasions are skin scrapes. Their treatment depends on how large and deep the abrasion is. Abrasions do not extend through all layers of the skin. A cut or lesion through all skin layers is called a laceration. HOME CARE INSTRUCTIONS   If you were given a dressing, change it at least once a day or as instructed by your caregiver. If the bandage sticks, soak it off with a solution of water or hydrogen peroxide.   Twice a day, wash the area with soap and water to remove all the cream/ointment. You may do this in a sink, under a tub faucet, or in a shower. Rinse off the soap and pat dry with a clean towel. Look for signs of infection (see below).   Reapply cream/ointment according to your caregiver's instruction. This will help prevent infection and keep the bandage from sticking. Telfa or gauze over the wound and under the dressing or wrap will also help keep the bandage from sticking.   If the bandage becomes wet, dirty, or develops a foul smell, change it as soon as possible.   Only take over-the-counter or prescription medicines for pain, discomfort, or fever as directed by your caregiver.  SEEK IMMEDIATE MEDICAL CARE IF:   Increasing pain in the wound.   Signs of infection develop: redness, swelling, surrounding area is tender to touch, or pus coming from the wound.   You have a fever.   Any foul smell coming from the wound or dressing.  Most skin wounds heal within ten days. Facial wounds heal faster. However, an infection may occur despite proper treatment. You should have the wound checked for signs of infection within 24 to 48 hours or sooner if problems arise. If you were not given a wound-check appointment, look closely at the wound yourself on the second day for early signs of infection listed above. MAKE SURE YOU:   Understand these  instructions.   Will watch your condition.   Will get help right away if you are not doing well or get worse.  Document Released: 11/16/2004 Document Revised: 10/19/2010 Document Reviewed: 09/25/2007 [facility] Patient Information 2012 [place], [place].       Home Safety and Preventing Falls Falls are a leading cause of injury and while they affect all age groups, falls have greater short-term and long-term impact on older age groups. However, falls should not be a part of life or aging. It is possible for individuals and their families to use preventive measures to significantly decrease the likelihood that anyone, especially an older adult, will fall. There are many simple measures which can make your home safer with respect to preventing falls. The following actions can help reduce falls among all members of your family and are especially important as you age, when your balance, lower limb strength, coordination, and eyesight may be declining. The use of preventive measures will help to reduce you and your family's risk of falls and serious medical consequences. OUTDOORS  Repair cracks and edges of walkways and driveways.   Remove high doorway thresholds and trim shrubbery on the main path into your home.   Ensure there is good outside lighting at main entrances and along main walkways.   Clear walkways of tools, rocks, debris, and clutter.   Check that handrails are not broken and are securely fastened. Both sides of steps should  have handrails.   In the garage, be attentive to and clean up grease or oil spills on the cement. This can make the surface extremely slippery.   In winter, have leaves, snow, and ice cleared regularly.   Use sand or salt on walkways during winter months.  BATHROOM  Install grab bars by the toilet and in the tub and shower.   Use non-skid mats or decals in the tub or shower.   If unable to easily stand unsupported while showering, place a plastic non  slip stool in the shower to sit on when needed.   Install night lights.   Keep floors dry and clean up all water on the floor immediately.   Remove soap buildup in tub or shower on a regular basis.   Secure bath mats with non-slip, double-sided rug tape.   Remove tripping hazards from the floors.  BEDROOMS  Install night lights.   Do not use oversized bedding.   Make sure a bedside light is easy to reach.   Keep a telephone by your bedside.   Make sure that you can get in and out of your bed easily.   Have a firm chair, with side arms, to use for getting dressed.   Remove clutter from around closets.   Store clothing, bed coverings, and other household items where you can reach them comfortably.   Remove tripping hazards from the floor.  LIVING AREAS AND STAIRWAYS  Turn on lights to avoid having to walk through dark areas.   Keep lighting uniform in each room. Place brighter lightbulbs in darker areas, including stairways.   Replace lightbulbs that burn out in stairways immediately.   Arrange furniture to provide for clear pathways.   Keep furniture in the same place.   Eliminate or tape down electrical cables in high traffic areas.   Place handrails on both sides of stairways. Use handrails when going up or down stairs.   Most falls occur on the top or bottom 3 steps.   Fix any loose handrails. Make sure handrails on both sides of the stairways are as long as the stairs.   Remove all walkway obstacles.   Coil or tape electrical cords off to the side of walking areas and out of the way. If using many extension cords, have an electrician put in a new wall outlet to reduce or eliminate them.   Make sure spills are cleaned up quickly and allow time for drying before walking on freshly cleaned floors.   Firmly attach carpet with non-skid or two-sided tape.   Keep frequently used items within easy reach.   Remove tripping hazards such as throw rugs and clutter  in walkways. Never leave objects on stairs.   Get rid of throw rugs elsewhere if possible.   Eliminate uneven floor surfaces.   Make sure couches and chairs are easy to get into and out of.   Check carpeting to make sure it is firmly attached along stairs.   Make repairs to worn or loose carpet promptly.   Select a carpet pattern that does not visually hide the edge of steps.   Avoid placing throw rugs or scatter rugs at the top or bottom of stairways, or properly secure with carpet tape to prevent slippage.   Have an electrician put in a light switch at the top and bottom of the stairs.   Get light switches that glow.   Avoid the following practices: hurrying, inattention, obscured vision, carrying large loads, and  wearing slip-on shoes.   Be aware of all pets.  KITCHEN  Place items that are used frequently, such as dishes and food, within easy reach.   Keep handles on pots and pans toward the center of the stove. Use back burners when possible.   Make sure spills are cleaned up quickly and allow time for drying.   Avoid walking on wet floors.   Avoid hot utensils and knives.   Position shelves so they are not too high or low.   Place commonly used objects within easy reach.   If necessary, use a sturdy step stool with a grab bar when reaching.   Make sure electrical cables are out of the way.   Do not use floor polish or wax that makes floors slippery.  OTHER HOME FALL PREVENTION STRATEGIES  Wear low heel or rubber sole shoes that are supportive and fit well.   Wear closed toe shoes.   Know and watch for side effects of medications. Have your caregiver or pharmacist look at all your medicines, even over-the-counter medicines. Some medicines can make you sleepy or dizzy.   Exercise regularly. Exercise makes you stronger and improves your balance and coordination.   Limit use of alcohol.   Use eyeglasses if necessary and keep them clean. Have your vision  checked every year.   Organize your household in a manner that minimizes the need to walk distances when hurried, or go up and down stairs unnecessarily. For example, have a phone placed on at least each floor of your home. If possible, have a phone beside each sitting or lying area where you spend the most time at home. Keep emergency numbers posted at all phones.   Use non-skid floor wax.   When using a ladder, make sure:   The base is firm.   All ladder feet are on level ground.   The ladder is angled against the wall properly.   When climbing a ladder, face the ladder and hold the ladder rungs firmly.   If reaching, always keep your hips and body weight centered between the rails.   When using a stepladder, make sure it is fully opened and both spreaders are firmly locked.   Do not climb a closed stepladder.   Avoid climbing beyond the second step from the top of a stepladder and the 4th rung from the top of an extension ladder.   Learn and use mobility aids as needed.   Change positions slowly. Arise slowly from sitting and lying positions. Sit on the edge of your bed before getting to your feet.   If you have a history of falls, ask someone to add color or contrast paint or tape to grab bars and handrails in your home.   If you have a history of falls, ask someone to place contrasting color strips on first and last steps.   Install an electrical emergency response system if you need one, and know how to use it.   If you have a medical or other condition that causes you to have limited physical strength, it is important that you reach out to family and friends for occasional help.  FOR CHILDREN:  If young children are in the home, use safety gates. At the top of stairs use screw-mounted gates; use pressure-mounted gates for the bottom of the stairs and doorways between rooms.   Young children should be taught to descend stairs on their stomachs, feet first, and later using  the handrail.  Keep drawers fully closed to prevent them from being climbed on or pulled out entirely.   Move chairs, cribs, beds and other furniture away from windows.   Consider installing window guards on windows ground floor and up, unless they are emergency fire exits. Make sure they have easy release mechanisms.   Consider installing special locks that only allow the window to be opened to a certain height.   Never rely on window screens to prevent falls.   Never leave babies alone on changing tables, beds or sofas. Use a changing table that has a restraining strap.   When a child can pull to a standing position, the crib mattress should be adjusted to its lowest position. There should be at least 26 inches between the top rails of the crib drop side and the mattress. Toys, bumper pads, and other objects that can be used as steps to climb out should be removed from the crib.   On bunk beds never allow a child under age 76 to sleep on the top bunk. For older children, if the upper bunk is not against a wall, use guard rails on both sides. No matter how old a child is, keep the guard rails in place on the top bunk since children roll during sleep. Do not permit horseplay on bunks.   Grass and soil surfaces beneath backyard playground equipment should be replaced with hardwood chips, shredded wood mulch, sand, pea gravel, rubber, crushed stone, or another safer material at depths of at least 9 to 12 inches.   When riding bikes or using skates, skateboards, skis, or snowboards, require children to wear helmets. Look for those that have stickers stating that they meet or exceed safety standards.   Vertical posts or pickets in deck, balcony, and stairway railings should be no more than 3 1/2 inches apart if a young baby will have access to the area. The space between horizontal rails or bars, and between the floor and the first horizontal rail or bar, should be no more than 3 1/2 inches.    Document Released: 01/27/2002 Document Revised: 10/19/2010 Document Reviewed: 11/26/2008 [facility] Patient Information 2012 [place], [place].

## 2011-04-07 NOTE — ED Notes (Addendum)
Pt was across street w/girlfriend at md appt.  Uses a cane to ambulate and had a possible loss of balance, but denied syncope to ems.  Has abrasion to back of head w/head pain w/ NO LOC but denies pain elsewhere.   EMS BP 182/110, HR 94, RR16

## 2011-04-07 NOTE — ED Notes (Signed)
ZOX:[id number]<AV> Expected date:04/07/11<BR> Expected time:11:21 AM<BR> Means of arrival:Ambulance<BR> Comments:<BR> EMS 35 GC - fall/immobilized

## 2011-04-07 NOTE — ED Notes (Signed)
Patient transported to CT on stretcher.  C-collar in place.

## 2011-04-07 NOTE — ED Provider Notes (Signed)
History     CSN: [id number]  Arrival date & time 04/07/11  1130   First MD Initiated Contact with Patient 04/07/11 1210      Chief Complaint  Patient presents with  . Fall  . Head Laceration    back of head.  NO LOC.    (Consider location/radiation/quality/duration/timing/severity/associated sxs/prior treatment) Patient is a 56 y.o. male presenting with fall. The history is provided by the patient.  Fall Incident onset: just prior to arrival.  Pt with hx of recurrent falls secondary to unsteadiness presents with fall from standing that occurred when he lost his footing while walking on an uneven parking lot. Larey Seat forward toward a car, then to the ground striking his head on the concrete. There was no loss of consciousness. Pt takes 81mg  ASA daily, no other blood thinners. The pt complains only of mild head pain and an abrasion at the site of impact. Denies change in vision, grogginess, neck pain, chest pain, SOB, abd pain, N/V, or new numbness/weakness to the arms or legs. Nothing makes the pain better or worse. He was fully immobilized PTA by EMS.  Past Medical History  Diagnosis Date  . Hypertension   . Dizziness   . Headache   . Spasticity   . Hypercholesteremia   . Acid reflux     Past Surgical History  Procedure Date  . Spinal tumor    . Hernia repair     History reviewed. No pertinent family history.  History  Substance Use Topics  . Smoking status: Former Games developer  . Smokeless tobacco: Not on file  . Alcohol Use: No      Review of Systems 10 systems reviewed and are negative for acute change except as noted in the HPI.  Allergies  Review of patient's allergies indicates no known allergies.  Home Medications   Current Outpatient Rx  Name Route Sig Dispense Refill  . AMITRIPTYLINE HCL 10 MG PO TABS Oral Take 10-20 mg by mouth 2 (two) times daily. 1 in am, 2 in pm    . ASPIRIN EC 81 MG PO TBEC Oral Take 81 mg by mouth daily.    Marland Kitchen BACLOFEN 10 MG PO TABS  Oral Take 10 mg by mouth 2 (two) times daily.     Marland Kitchen CARBAMAZEPINE 200 MG PO TABS Oral Take 300 mg by mouth 2 (two) times daily.    Marland Kitchen MECLIZINE HCL 12.5 MG PO TABS Oral Take 12.5 mg by mouth 3 (three) times daily as needed. For dizziness    . METOPROLOL TARTRATE 50 MG PO TABS Oral Take 50 mg by mouth 2 (two) times daily.      Marland Kitchen OMEPRAZOLE 20 MG PO CPDR Oral Take 20 mg by mouth 2 (two) times daily.      Marland Kitchen PRAVASTATIN SODIUM 40 MG PO TABS Oral Take 40 mg by mouth daily.      Marland Kitchen PRESCRIPTION MEDICATION Topical Apply 1 application topically daily as needed. Face cream for rosacea.      BP 149/94  Pulse 94  Temp(Src) 97.4 F (36.3 C) (Oral)  Resp 20  Ht 6' (1.829 m)  Wt 195 lb (88.451 kg)  BMI 26.45 kg/m2  SpO2 100%  Physical Exam  Nursing note and vitals reviewed. Constitutional: He is oriented to person, place, and time. He appears well-developed and well-nourished. No distress.  HENT:  Head: Normocephalic.    Right Ear: External ear normal.  Left Ear: External ear normal.  Mouth/Throat: Oropharynx is clear and moist.  Decreased hearing acuity, chronic  Eyes: Conjunctivae and EOM are normal. Pupils are equal, round, and reactive to light.  Neck:       c-collar in place  Cardiovascular: Normal rate, regular rhythm, normal heart sounds and intact distal pulses.   Pulmonary/Chest: Effort normal and breath sounds normal. No respiratory distress. He has no wheezes. He exhibits no tenderness.  Abdominal: Soft. Bowel sounds are normal. He exhibits no distension. There is no tenderness.  Musculoskeletal: Normal range of motion. He exhibits no edema. Tenderness: mild TTP over proximal right humerus- reportedly from fall 2 weeks ago, not previously evaluated. No deformity or decreased strenght/ROM/pulses.  Neurological: He is alert and oriented to person, place, and time. No cranial nerve deficit. Abnormal coordination: F-N intact bilaterally.  Skin: Skin is warm and dry.  Psychiatric:  He has a normal mood and affect.    ED Course  Procedures (including critical care time)  Labs Reviewed - No data to display Ct Head Wo Contrast  04/07/2011  *RADIOLOGY REPORT*  Clinical Data:  Fall.  Head injury  CT HEAD WITHOUT CONTRAST CT CERVICAL SPINE WITHOUT CONTRAST  Technique:  Multidetector CT imaging of the head and cervical spine was performed following the standard protocol without intravenous contrast.  Multiplanar CT image reconstructions of the cervical spine were also generated.  Comparison:  CT head 11/18/2010  CT HEAD  Findings: Moderately large scalp hematoma right parietal region. Negative for skull fracture.  Negative for intracranial hemorrhage.  No acute infarct or mass. Generalized atrophy.  IMPRESSION: Right parietal scalp hematoma.  No acute intracranial abnormality.  CT CERVICAL SPINE  Findings: Accentuated kyphosis at C7-T1.  Total laminectomy of C6 into the upper thoracic spine.  The lower extent of the laminectomy is not scanned.  Anterior spurring is present at C6-7.  Disc degeneration also at C7-C1.  Remaining cervical disc spaces are intact.  Mild facet degeneration.  Negative for fracture.  Large calcification in the left thyroid has a benign appearance.  IMPRESSION: Multilevel laminectomy with accentuated cervical kyphosis.  Negative for fracture.  Original Report Authenticated By: Camelia Phenes, M.D.   Ct Cervical Spine Wo Contrast  04/07/2011  *RADIOLOGY REPORT*  Clinical Data:  Fall.  Head injury  CT HEAD WITHOUT CONTRAST CT CERVICAL SPINE WITHOUT CONTRAST  Technique:  Multidetector CT imaging of the head and cervical spine was performed following the standard protocol without intravenous contrast.  Multiplanar CT image reconstructions of the cervical spine were also generated.  Comparison:  CT head 11/18/2010  CT HEAD  Findings: Moderately large scalp hematoma right parietal region. Negative for skull fracture.  Negative for intracranial hemorrhage.  No acute  infarct or mass. Generalized atrophy.  IMPRESSION: Right parietal scalp hematoma.  No acute intracranial abnormality.  CT CERVICAL SPINE  Findings: Accentuated kyphosis at C7-T1.  Total laminectomy of C6 into the upper thoracic spine.  The lower extent of the laminectomy is not scanned.  Anterior spurring is present at C6-7.  Disc degeneration also at C7-C1.  Remaining cervical disc spaces are intact.  Mild facet degeneration.  Negative for fracture.  Large calcification in the left thyroid has a benign appearance.  IMPRESSION: Multilevel laminectomy with accentuated cervical kyphosis.  Negative for fracture.  Original Report Authenticated By: Camelia Phenes, M.D.   Dg Humerus Right  04/07/2011  *RADIOLOGY REPORT*  Clinical Data: Recent fall, generalized upper arm pain  RIGHT HUMERUS - 2+ VIEW  Comparison: None.  Findings: The humerus appears intact.  The humeral head  is in normal position.  No opaque foreign body is seen.  IMPRESSION: Negative.  Original Report Authenticated By: Juline Patch, M.D.     1. Fall   2. Scalp abrasion       MDM  Mechanical fall in a pt with chronic unsteady gait. CT scan head/neck and x-ray right humerus reviewed with no acute findings. No wound repair necessary for scalp abrasion, Will d/c home.        Shaaron Adler, [place] 04/07/11 1456

## 2011-04-09 NOTE — ED Provider Notes (Signed)
Medical screening examination/treatment/procedure(s) were performed by non-physician practitioner and as supervising physician I was immediately available for consultation/collaboration.  Toy Baker, MD 04/09/11 [phone]

## 2012-08-05 ENCOUNTER — Other Ambulatory Visit (HOSPITAL_COMMUNITY): Payer: Self-pay | Admitting: Family Medicine

## 2012-08-05 DIAGNOSIS — R52 Pain, unspecified: Secondary | ICD-10-CM

## 2012-08-09 ENCOUNTER — Other Ambulatory Visit (HOSPITAL_COMMUNITY): Payer: Self-pay | Admitting: Family Medicine

## 2012-08-09 ENCOUNTER — Ambulatory Visit (HOSPITAL_COMMUNITY)
Admission: RE | Admit: 2012-08-09 | Discharge: 2012-08-09 | Disposition: A | Discharge status: Home / Self Care | Payer: Medicare Other | Source: Ambulatory Visit | Attending: Family Medicine | Admitting: Family Medicine

## 2012-08-09 DIAGNOSIS — M79609 Pain in unspecified limb: Secondary | ICD-10-CM | POA: Insufficient documentation

## 2012-08-09 DIAGNOSIS — R52 Pain, unspecified: Secondary | ICD-10-CM

## 2013-09-28 ENCOUNTER — Encounter (HOSPITAL_COMMUNITY): Payer: Self-pay | Admitting: Emergency Medicine

## 2013-09-28 ENCOUNTER — Emergency Department (HOSPITAL_COMMUNITY)
Admission: EM | Admit: 2013-09-28 | Discharge: 2013-09-28 | Disposition: A | Discharge status: Home / Self Care | Payer: Medicare Other | Attending: Emergency Medicine | Admitting: Emergency Medicine

## 2013-09-28 DIAGNOSIS — K047 Periapical abscess without sinus: Secondary | ICD-10-CM | POA: Diagnosis not present

## 2013-09-28 DIAGNOSIS — Z8669 Personal history of other diseases of the nervous system and sense organs: Secondary | ICD-10-CM | POA: Insufficient documentation

## 2013-09-28 DIAGNOSIS — E7800 Pure hypercholesterolemia, unspecified: Secondary | ICD-10-CM | POA: Diagnosis not present

## 2013-09-28 DIAGNOSIS — Z87891 Personal history of nicotine dependence: Secondary | ICD-10-CM | POA: Insufficient documentation

## 2013-09-28 DIAGNOSIS — I10 Essential (primary) hypertension: Secondary | ICD-10-CM | POA: Diagnosis not present

## 2013-09-28 DIAGNOSIS — K002 Abnormalities of size and form of teeth: Secondary | ICD-10-CM | POA: Diagnosis not present

## 2013-09-28 DIAGNOSIS — K029 Dental caries, unspecified: Secondary | ICD-10-CM | POA: Diagnosis not present

## 2013-09-28 DIAGNOSIS — K089 Disorder of teeth and supporting structures, unspecified: Secondary | ICD-10-CM | POA: Diagnosis present

## 2013-09-28 DIAGNOSIS — K219 Gastro-esophageal reflux disease without esophagitis: Secondary | ICD-10-CM | POA: Diagnosis not present

## 2013-09-28 DIAGNOSIS — Z79899 Other long term (current) drug therapy: Secondary | ICD-10-CM | POA: Insufficient documentation

## 2013-09-28 DIAGNOSIS — Z7982 Long term (current) use of aspirin: Secondary | ICD-10-CM | POA: Diagnosis not present

## 2013-09-28 HISTORY — DX: Other cerebrovascular disease: I67.89

## 2013-09-28 MED ORDER — HYDROCODONE-ACETAMINOPHEN 5-325 MG PO TABS: 1.0000 | ORAL_TABLET | ORAL | Status: DC | PRN

## 2013-09-28 MED ORDER — AMOXICILLIN 500 MG PO CAPS: 500.0000 mg | ORAL_CAPSULE | Freq: Three times a day (TID) | ORAL | Status: AC

## 2013-09-28 NOTE — Discharge Instructions (Signed)
Dental Abscess °A dental abscess is a collection of infected fluid (pus) from a bacterial infection in the inner part of the tooth (pulp). It usually occurs at the end of the tooth's root.  °CAUSES  °· Severe tooth decay. °· Trauma to the tooth that allows bacteria to enter into the pulp, such as a broken or chipped tooth. °SYMPTOMS  °· Severe pain in and around the infected tooth. °· Swelling and redness around the abscessed tooth or in the mouth or face. °· Tenderness. °· Pus drainage. °· Bad breath. °· Bitter taste in the mouth. °· Difficulty swallowing. °· Difficulty opening the mouth. °· Nausea. °· Vomiting. °· Chills. °· Swollen neck glands. °DIAGNOSIS  °· A medical and dental history will be taken. °· An examination will be performed by tapping on the abscessed tooth. °· X-rays may be taken of the tooth to identify the abscess. °TREATMENT °The goal of treatment is to eliminate the infection. You may be prescribed antibiotic medicine to stop the infection from spreading. A root canal may be performed to save the tooth. If the tooth cannot be saved, it may be pulled (extracted) and the abscess may be drained.  °HOME CARE INSTRUCTIONS °· Only take over-the-counter or prescription medicines for pain, fever, or discomfort as directed by your caregiver. °· Rinse your mouth (gargle) often with salt water (¼ tsp salt in 8 oz [250 ml] of warm water) to relieve pain or swelling. °· Do not drive after taking pain medicine (narcotics). °· Do not apply heat to the outside of your face. °· Return to your dentist for further treatment as directed. °SEEK MEDICAL CARE IF: °· Your pain is not helped by medicine. °· Your pain is getting worse instead of better. °SEEK IMMEDIATE MEDICAL CARE IF: °· You have a fever or persistent symptoms for more than 2-3 days. °· You have a fever and your symptoms suddenly get worse. °· You have chills or a very bad headache. °· You have problems breathing or swallowing. °· You have trouble  opening your mouth. °· You have swelling in the neck or around the eye. °Document Released: 02/06/2005 Document Revised: 11/01/2011 Document Reviewed: 05/17/2010 °[employer]® Patient Information ©2015 [employer]. This information is not intended to replace advice given to you by your health care provider. Make sure you discuss any questions you have with your health care provider. ° ° °Complete your entire course of antibiotics as prescribed.  You  may use the hydrocodone for pain relief but do not drive within 4 hours of taking as this will make you drowsy.  Avoid applying heat or ice to this abscess area which can worsen your symptoms.  You may use warm salt water swish and spit treatment or half peroxide and water swish and spit after meals to keep this area clean as discussed.  Call the dentist listed above for further management of your symptoms. ° ° °

## 2013-09-28 NOTE — ED Provider Notes (Signed)
CSN: [id number]     Arrival date & time 09/28/13  1237 History  This chart was scribed for non-physician practitioner, Evalee Jefferson, PA-C,working with Nat Christen, MD, by Marlowe Kays, ED Scribe. This patient was seen in room APFT20/[facility] and the patient's care was started at 1:24 PM.  Chief Complaint  Patient presents with  . Dental Pain   Patient is a 58 y.o. male presenting with tooth pain. The history is provided by the patient. No language interpreter was used.  Dental Pain Associated symptoms: facial swelling   Associated symptoms: no fever and no neck pain    HPI Comments:  Travis Phillips is a 58 y.o. male with PMH of GERD, HTN and DM who presents to the Emergency Department complaining of severe left lower dental pain that started yesterday. He reports associated left-sided facial swelling. Wife reports pt broke a tooth several years ago and never received dental care for it. She states he does not have a dentist. Pt denies taking anything for pain. He denies fever, nausea or vomiting. Pt denies allergies to any medication.  Past Medical History  Diagnosis Date  . Hypertension   . Dizziness   . Headache(784.0)   . Spasticity   . Hypercholesteremia   . Acid reflux   . Superficial siderosis of central nervous system 2012   Past Surgical History  Procedure Laterality Date  . Spinal tumor     . Hernia repair     No family history on file. History  Substance Use Topics  . Smoking status: Former Research scientist (life sciences)  . Smokeless tobacco: Not on file  . Alcohol Use: No    Review of Systems  Constitutional: Negative for fever.  HENT: Positive for dental problem and facial swelling. Negative for sore throat.   Respiratory: Negative for shortness of breath.   Gastrointestinal: Negative for nausea and vomiting.  Musculoskeletal: Negative for neck pain and neck stiffness.    Allergies  Review of patient's allergies indicates no known allergies.  Home Medications   Prior to Admission  medications   Medication Sig Start Date End Date Taking? Authorizing Provider  amitriptyline (ELAVIL) 10 MG tablet Take 10-20 mg by mouth 2 (two) times daily. 1 in am, 2 in pm    Historical Provider, MD  amoxicillin (AMOXIL) 500 MG capsule Take 1 capsule (500 mg total) by mouth 3 (three) times daily. 09/28/13 10/08/13  Evalee Jefferson, PA-C  aspirin EC 81 MG tablet Take 81 mg by mouth daily.    Historical Provider, MD  baclofen (LIORESAL) 10 MG tablet Take 10 mg by mouth 2 (two) times daily.     Historical Provider, MD  carbamazepine (TEGRETOL) 200 MG tablet Take 300 mg by mouth 2 (two) times daily.    Historical Provider, MD  HYDROcodone-acetaminophen (NORCO/VICODIN) 5-325 MG per tablet Take 1 tablet by mouth every 4 (four) hours as needed. 09/28/13   Evalee Jefferson, PA-C  meclizine (ANTIVERT) 12.5 MG tablet Take 12.5 mg by mouth 3 (three) times daily as needed. For dizziness    Historical Provider, MD  metoprolol (LOPRESSOR) 50 MG tablet Take 50 mg by mouth 2 (two) times daily.      Historical Provider, MD  omeprazole (PRILOSEC) 20 MG capsule Take 20 mg by mouth 2 (two) times daily.      Historical Provider, MD  pravastatin (PRAVACHOL) 40 MG tablet Take 40 mg by mouth daily.      Historical Provider, MD  PRESCRIPTION MEDICATION Apply 1 application topically daily as needed.  Face cream for rosacea.    Historical Provider, MD   There were no vitals taken for this visit. Physical Exam  Constitutional: He is oriented to person, place, and time. He appears well-developed and well-nourished. No distress.  HENT:  Head: Normocephalic and atraumatic.  Right Ear: Tympanic membrane and external ear normal.  Left Ear: Tympanic membrane and external ear normal.  Mouth/Throat: Oropharynx is clear and moist and mucous membranes are normal. No oral lesions. No trismus in the jaw. Abnormal dentition. Dental caries present. No dental abscesses.  No obvious source of infection. Large filling in left lower second molar which  may have decay around it. Small indurated knot along left jaw line without fluctuance.  Eyes: Conjunctivae are normal.  Neck: Normal range of motion. Neck supple.  Cardiovascular: Normal rate and normal heart sounds.   Pulmonary/Chest: Effort normal.  Abdominal: He exhibits no distension.  Musculoskeletal: Normal range of motion.  Lymphadenopathy:    He has no cervical adenopathy.  Neurological: He is alert and oriented to person, place, and time.  Skin: Skin is warm and dry. No erythema.  Psychiatric: He has a normal mood and affect.    ED Course  Procedures (including critical care time) DIAGNOSTIC STUDIES:  COORDINATION OF CARE: 1:29 PM- Will prescribe Vicodin and antibiotic. Will refer to dentist. Advised pt to avoid ice and heat. Advised pt of return precautions. Pt verbalizes understanding and agrees to plan.  Medications - No data to display  Labs Review Labs Reviewed - No data to display  Imaging Review No results found.   EKG Interpretation None      MDM   Final diagnoses:  Dental abscess    Obvious dental infection without drainable abscess.  Suspect left lower second molar is the source of this infection. Pt states has an old fractured tooth on this left lower jawline which is not obvious on todays exam.    I personally performed the services described in this documentation, which was scribed in my presence. The recorded information has been reviewed and is accurate.    Evalee Jefferson, PA-C 09/28/13 2146

## 2013-09-28 NOTE — ED Notes (Signed)
PT c/o left lower dental pain x1 day. Swelling noted to left side of face.

## 2013-09-30 NOTE — ED Provider Notes (Signed)
Medical screening examination/treatment/procedure(s) were performed by non-physician practitioner and as supervising physician I was immediately available for consultation/collaboration.   EKG Interpretation None       Nat Christen, MD 09/30/13 1109

## 2013-12-31 ENCOUNTER — Encounter (HOSPITAL_COMMUNITY): Payer: Self-pay | Admitting: Physical Therapy

## 2013-12-31 ENCOUNTER — Ambulatory Visit (HOSPITAL_COMMUNITY)
Admission: RE | Admit: 2013-12-31 | Discharge: 2013-12-31 | Disposition: A | Discharge status: Home / Self Care | Payer: Medicare Other | Source: Ambulatory Visit | Attending: Internal Medicine | Admitting: Internal Medicine

## 2013-12-31 DIAGNOSIS — E119 Type 2 diabetes mellitus without complications: Secondary | ICD-10-CM | POA: Insufficient documentation

## 2013-12-31 DIAGNOSIS — L89153 Pressure ulcer of sacral region, stage 3: Secondary | ICD-10-CM | POA: Insufficient documentation

## 2013-12-31 DIAGNOSIS — Z5189 Encounter for other specified aftercare: Secondary | ICD-10-CM | POA: Diagnosis present

## 2013-12-31 DIAGNOSIS — L8993 Pressure ulcer of unspecified site, stage 3: Secondary | ICD-10-CM

## 2013-12-31 NOTE — Therapy (Addendum)
Physical Therapy Evaluation  Patient Details  Name: Travis Phillips MRN: [id number] Date of Birth: 05/17/1955  Encounter Date: 25-Jan-2014      PT End of Session - 01-25-2014 1032    Visit Number 1   Number of Visits 8   Date for PT Re-Evaluation 01/30/14   Authorization Type [facility]   Authorization - Visit Number 1   Authorization - Number of Visits 8   PT Start Time 0845   PT Stop Time 0915   PT Time Calculation (min) 30 min   Activity Tolerance Patient tolerated treatment well      Past Medical History  Diagnosis Date  . Hypertension   . Dizziness   . Headache(784.0)   . Spasticity   . Hypercholesteremia   . Acid reflux   . Superficial siderosis of central nervous system 2012    Past Surgical History  Procedure Laterality Date  . Spinal tumor     . Hernia repair      There were no vitals taken for this visit.  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 Girlfriend is with patient who writes communication down.  Pt girlfriend states that the pt has had a sore on his bottom for three weeks now.  She has been trying to take care of it but it is not healing.    How long can you sit comfortably? sits all the time    How long can you walk comfortably? minmially    Currently in Pain? Yes   Pain Score 2    Pain Location Sacrum   Pain Orientation Right              PT Education - 01-25-2014 1031    Education provided Yes   Education Details Pt not to sit for any greater than 15 minutes without standing up and walking to decrease pressure and improve circulation, make sure blood surgars stay wnl    Person(s) Educated Caregiver(s)   Methods Explanation   Comprehension Verbalized understanding              Plan - 2014-01-25 1033    Clinical Impression Statement Pt to be see 2x week x 4 week for debridement and dressing of wound   Pt will benefit from skilled  therapeutic intervention in order to improve on the following deficits Other (comment)  debridement of slough to promote healing environment   Rehab Potential Good   PT Frequency 2x / week   PT Duration 4 weeks   PT Treatment/Interventions Patient/family education;Other (comment)   PT Next Visit Plan debridement dressing with honey gel    Consulted and Agree with Plan of Care Family member/caregiver;Patient   Family Member Consulted girlfriend          G-Codes - January 25, 2014 1035    Functional Limitation Other PT subsequent   Other PT Secondary Current Status (A2505) At least 20 percent but less than 40 percent impaired, limited or restricted   Other PT Secondary Goal Status (L9767) At least 1 percent but less than 20 percent impaired, limited or restricted      Problem List Patient Active Problem List   Diagnosis Date Noted  . HYPERLIPIDEMIA-MIXED 11/28/2008  . HYPERTENSION, UNSPECIFIED 11/28/2008  . CAD, NATIVE VESSEL 11/28/2008  . MURMUR 11/28/2008  . CONSTIPATION 11/26/2008  . HEMATOCHEZIA 09/08/2008  . WEIGHT LOSS, ABNORMAL 09/08/2008  .  NAUSEA, CHRONIC 09/08/2008  . CHANGE IN BOWELS 09/08/2008  . ABDOMINAL PAIN OTHER SPECIFIED SITE 09/08/2008  . THYROID NODULE, HX OF 09/08/2008                Wound Therapy - 12/31/13 0919    Pressure Ulcer Properties Date First Assessed: 12/31/13 Time First Assessed: 0845 Location: Sacrum  ( R superior gluteal cleft)Staging: Stage III -  Full thickness tissue loss. Subcutaneous fat may be visible but bone, tendon or muscle are NOT exposed.   Dressing Type --  honeygel   Dressing Change Frequency PRN   State of Healing Eschar   Site / Wound Assessment Yellow;Pale   % Wound base Red or Granulating 40%   % Wound base Yellow 60%   Peri-wound Assessment Maceration   Wound Length (cm) 1.5 cm   Wound Width (cm) 1.5 cm   Wound Depth (cm) 0.3 cm   Drainage Amount Minimal  per girlfriend no dressing therefore difficulty to assess    Treatment Cleansed;Debridement (Selective)   Selective Debridement - Location wound bed   Selective Debridement - Tools Used Scalpel   Selective Debridement - Tissue Removed slough   Wound Therapy - Clinical Statement Pt is a 58 yo male with a nonhealing pressure ulcer.  Pt has been referred and will benefit from skilled PT to improve the wound site envirornment to increase healing    Wound Therapy - Functional Problem List Painful when sitting   Factors Delaying/Impairing Wound Healing Diabetes Mellitus;Incontinence;Infection - systemic/local;Immobility;Multiple medical problems;Polypharmacy   Hydrotherapy Plan Debridement;Dressing change   Wound Therapy - Frequency Other (comment)  2x/week   Wound Therapy - Current Recommendations PT   Decrease Necrotic Tissue to 0   Decrease Necrotic Tissue - Progress Goal set today  2 weeks   Increase Granulation Tissue to 100   Increase Granulation Tissue - Progress Goal set today  2 weeks   Decrease Length/Width/Depth by (cm) 1.0 cm  4 weeks   Decrease Length/Width/Depth - Progress Goal set today   Improve Drainage Characteristics Other (comment)  none-4 weeks   Improve Drainage Characteristics - Progress Goal set today   Patient/Family will be able to  self dress until totally healed   Patient/Family Instruction Goal - Progress Goal set today   Additional Wound Therapy Goal Pt to be able to sit with comfort   Additional Wound Therapy Goal - Progress Goal set today        Azucena Freed PT/CLT 12/31/2013, 10:36 AM

## 2014-01-06 ENCOUNTER — Ambulatory Visit (HOSPITAL_COMMUNITY)
Admission: RE | Admit: 2014-01-06 | Discharge: 2014-01-06 | Disposition: A | Discharge status: Home / Self Care | Payer: Medicare Other | Source: Ambulatory Visit | Attending: Family Medicine | Admitting: Family Medicine

## 2014-01-06 DIAGNOSIS — L8993 Pressure ulcer of unspecified site, stage 3: Secondary | ICD-10-CM

## 2014-01-06 DIAGNOSIS — Z5189 Encounter for other specified aftercare: Secondary | ICD-10-CM | POA: Diagnosis not present

## 2014-01-06 NOTE — Therapy (Signed)
Wound Care Therapy  Patient Details  Name: Travis Phillips MRN: [id number] Date of Birth: 02/05/1956  Encounter Date: 01/06/2014      PT End of Session - 01/06/14 1835    Visit Number 2   Number of Visits 8   Date for PT Re-Evaluation 01/30/14   Authorization Type [employer]   Authorization - Visit Number 2   Authorization - Number of Visits 8   PT Start Time 5183   PT Stop Time 1555   PT Time Calculation (min) 20 min   Activity Tolerance Patient tolerated treatment well   Behavior During Therapy [facility] for tasks assessed/performed      Past Medical History  Diagnosis Date  . Hypertension   . Dizziness   . Headache(784.0)   . Spasticity   . Hypercholesteremia   . Acid reflux   . Superficial siderosis of central nervous system 2012    Past Surgical History  Procedure Laterality Date  . Spinal tumor     . Hernia repair      There were no vitals taken for this visit.  Visit Diagnosis:  Pressure ulcer stage III         PT Short Term Goals - 01/06/14 1827    PT SHORT TERM GOAL #1   Title decrease necrotic tissue to 0   Time 2   Period Weeks   PT SHORT TERM GOAL #2   Title Granulation tissue to be increased to 100%   Time 2   Period Weeks          PT Long Term Goals - 01/06/14 1828    PT LONG TERM GOAL #1   Title Wound size decreased by 1.0 cm diameter   Time 4   Period Weeks   PT LONG TERM GOAL #2   Title Pt famility to verbalize importance of cleansing and changing dressing on a regular basis    Time 4   Period Weeks   PT LONG TERM GOAL #3   Title no pain with sitting   Time 4   Period Weeks         Problem List Patient Active Problem List   Diagnosis Date Noted  . HYPERLIPIDEMIA-MIXED 11/28/2008  . HYPERTENSION, UNSPECIFIED 11/28/2008  . CAD, NATIVE VESSEL 11/28/2008  . MURMUR 11/28/2008  . CONSTIPATION 11/26/2008  . HEMATOCHEZIA 09/08/2008  . WEIGHT LOSS, ABNORMAL 09/08/2008  . NAUSEA, CHRONIC 09/08/2008  . CHANGE IN BOWELS  09/08/2008  . ABDOMINAL PAIN OTHER SPECIFIED SITE 09/08/2008  . THYROID NODULE, HX OF 09/08/2008             Wound Therapy - 01/06/14 1825    Subjective Caregiver states dressing was off before they got home.  States the medipore tape works the best.  CG reports increased drainage, however looks much better.   Pain Score 0-No pain   Pressure Ulcer Properties Date First Assessed: 12/31/13 Time First Assessed: 0845 Location: Sacrum Staging: Stage III -  Full thickness tissue loss. Subcutaneous fat may be visible but bone, tendon or muscle are NOT exposed.   Dressing Type Adhesive bandage;Hydrogel   Dressing Changed   Dressing Change Frequency PRN   State of Healing Eschar   Site / Wound Assessment Yellow;Pale   % Wound base Red or Granulating 45%   % Wound base Yellow 55%   Peri-wound Assessment Pink   Drainage Amount Minimal   Drainage Description Serous   Treatment Cleansed;Debridement (Selective)   Selective Debridement - Location wound bed  Selective Debridement - Tools Used Scalpel   Selective Debridement - Tissue Removed slough   Wound Therapy - Clinical Statement Wound drawing increased moisture with use of medihoney and was already showing maceration and would not stay in place.  Changed dressing to hydrogel and educated CG in use and also suggested some type of pressure relief as patient sits alot.  Caregiver reported she would look into this.    Wound Therapy - Functional Problem List --   Factors Delaying/Impairing Wound Healing --   Hydrotherapy Plan Debridement;Dressing change   Wound Therapy - Frequency Other (comment)  2x/week   Wound Therapy - Current Recommendations PT   Wound Plan Continue woundcare using appropriate dressings.  follow up on pressure relief to promote wound healing.   Decrease Necrotic Tissue to --   Increase Granulation Tissue to --   Decrease Length/Width/Depth by (cm) --  4 weeks   Improve Drainage Characteristics --  none-4 weeks    Patient/Family will be able to  --   Additional Wound Therapy Goal --       Teena Irani, PTA/CLT 01/06/2014, 6:36 PM

## 2014-01-07 NOTE — Addendum Note (Signed)
Encounter addended by: Leeroy Cha, PT on: 01/07/2014  2:47 PM<BR>     Documentation filed: Clinical Notes

## 2014-01-08 ENCOUNTER — Ambulatory Visit (HOSPITAL_COMMUNITY)
Admission: RE | Admit: 2014-01-08 | Discharge: 2014-01-08 | Disposition: A | Discharge status: Home / Self Care | Payer: Medicare Other | Source: Ambulatory Visit | Attending: Family Medicine | Admitting: Family Medicine

## 2014-01-08 ENCOUNTER — Encounter (HOSPITAL_COMMUNITY): Payer: Self-pay

## 2014-01-08 DIAGNOSIS — Z5189 Encounter for other specified aftercare: Secondary | ICD-10-CM | POA: Diagnosis not present

## 2014-01-08 DIAGNOSIS — L8993 Pressure ulcer of unspecified site, stage 3: Secondary | ICD-10-CM

## 2014-01-08 NOTE — Therapy (Signed)
Wound Care Therapy  Patient Details  Name: Travis Phillips MRN: [id number] Date of Birth: 06-11-1955  Encounter Date: 01/08/2014      PT End of Session - 01/08/14 1542    Visit Number 3   Number of Visits 8   Date for PT Re-Evaluation 01/30/14   Authorization Type [employer]   Authorization - Visit Number 3   Authorization - Number of Visits 8   PT Start Time 1505   PT Stop Time 1530   PT Time Calculation (min) 25 min   Activity Tolerance Patient tolerated treatment well   Behavior During Therapy [facility] for tasks assessed/performed      Past Medical History  Diagnosis Date  . Hypertension   . Dizziness   . Headache(784.0)   . Spasticity   . Hypercholesteremia   . Acid reflux   . Superficial siderosis of central nervous system 2012    Past Surgical History  Procedure Laterality Date  . Spinal tumor     . Hernia repair      There were no vitals taken for this visit.  Visit Diagnosis:  Pressure ulcer stage III            PT Short Term Goals - 01/08/14 1541    PT SHORT TERM GOAL #1   Title decrease necrotic tissue to 0   Time 2   Status On-going   PT SHORT TERM GOAL #2   Title Granulation tissue to be increased to 100%   Time 2   Period Weeks   Status On-going          PT Long Term Goals - 01/08/14 1541    PT LONG TERM GOAL #1   Title Wound size decreased by 1.0 cm diameter   Time 4   Status On-going   PT LONG TERM GOAL #2   Title Pt famility to verbalize importance of cleansing and changing dressing on a regular basis    Time 4   Status Achieved   PT LONG TERM GOAL #3   Title no pain with sitting   Time 4   Status On-going          Problem List Patient Active Problem List   Diagnosis Date Noted  . HYPERLIPIDEMIA-MIXED 11/28/2008  . HYPERTENSION, UNSPECIFIED 11/28/2008  . CAD, NATIVE VESSEL 11/28/2008  . MURMUR 11/28/2008  . CONSTIPATION 11/26/2008  . HEMATOCHEZIA 09/08/2008  . WEIGHT LOSS, ABNORMAL 09/08/2008  . NAUSEA,  CHRONIC 09/08/2008  . CHANGE IN BOWELS 09/08/2008  . ABDOMINAL PAIN OTHER SPECIFIED SITE 09/08/2008  . THYROID NODULE, HX OF 09/08/2008           Wound Therapy - 01/08/14 1534    Subjective Caregiver states dressing stayed in place until today.  States pt is trying to relieve pressure off area but is not doing as well as he should be.   Pain Assessment 0-10   Pain Score 0-No pain   Pressure Ulcer Properties Date First Assessed: 12/31/13 Time First Assessed: 0845 Location: Sacrum Staging: Stage III -  Full thickness tissue loss. Subcutaneous fat may be visible but bone, tendon or muscle are NOT exposed.   Dressing Type --  honey followed by 2x2 with medipore tape.   Dressing Changed   Dressing Change Frequency PRN   State of Healing Early/partial granulation   Site / Wound Assessment Yellow;Red   % Wound base Red or Granulating 60%   % Wound base Yellow 40%   Peri-wound Assessment Pink  Wound Length (cm) 1 cm   Wound Width (cm) 1 cm   Wound Depth (cm) 0.1 cm   Drainage Amount Minimal   Drainage Description Serous   Treatment Cleansed;Debridement (Selective)   Selective Debridement - Location wound bed   Selective Debridement - Tools Used Scalpel   Selective Debridement - Tissue Removed slough   Wound Therapy - Clinical Statement Pt had decreasing slough.  Pt slough able to be debrided 80% withscapel today.  If wound is free of yellow eschar next week pt can be discharged to caregiver for dressing changes    Factors Delaying/Impairing Wound Healing Diabetes Mellitus;Immobility   Hydrotherapy Plan Debridement;Dressing change;Patient/family education   Wound Therapy - Frequency --  2x week   Wound Therapy - Current Recommendations OT   Wound Plan continue with wound care pt maybe able to be discharge next week.           Azucena Freed PT/CLT [phone]   01/08/2014, 3:43 PM

## 2014-01-12 ENCOUNTER — Ambulatory Visit (HOSPITAL_COMMUNITY)
Admission: RE | Admit: 2014-01-12 | Discharge: 2014-01-12 | Disposition: A | Discharge status: Home / Self Care | Payer: Medicare Other | Source: Ambulatory Visit | Attending: Family Medicine | Admitting: Family Medicine

## 2014-01-12 DIAGNOSIS — Z5189 Encounter for other specified aftercare: Secondary | ICD-10-CM | POA: Diagnosis not present

## 2014-01-12 DIAGNOSIS — L8993 Pressure ulcer of unspecified site, stage 3: Secondary | ICD-10-CM

## 2014-01-12 NOTE — Therapy (Signed)
Wound Care Therapy  Patient Details  Name: Travis Phillips MRN: [id number] Date of Birth: 10/08/1955  Encounter Date: 01/12/2014      PT End of Session - 01/12/14 1614    Visit Number 4   Number of Visits 8   Date for PT Re-Evaluation 01/30/14   Authorization Type [employer]   Authorization - Visit Number 4   Authorization - Number of Visits 8   PT Start Time 8786   PT Stop Time 1602   PT Time Calculation (min) 24 min   Activity Tolerance Patient tolerated treatment well   Behavior During Therapy [facility] for tasks assessed/performed      Past Medical History  Diagnosis Date  . Hypertension   . Dizziness   . Headache(784.0)   . Spasticity   . Hypercholesteremia   . Acid reflux   . Superficial siderosis of central nervous system 2012    Past Surgical History  Procedure Laterality Date  . Spinal tumor     . Hernia repair      There were no vitals taken for this visit.  Visit Diagnosis:  Pressure ulcer stage III   Problem List Patient Active Problem List   Diagnosis Date Noted  . HYPERLIPIDEMIA-MIXED 11/28/2008  . HYPERTENSION, UNSPECIFIED 11/28/2008  . CAD, NATIVE VESSEL 11/28/2008  . MURMUR 11/28/2008  . CONSTIPATION 11/26/2008  . HEMATOCHEZIA 09/08/2008  . WEIGHT LOSS, ABNORMAL 09/08/2008  . NAUSEA, CHRONIC 09/08/2008  . CHANGE IN BOWELS 09/08/2008  . ABDOMINAL PAIN OTHER SPECIFIED SITE 09/08/2008  . THYROID NODULE, HX OF 09/08/2008         Wound Therapy - 01/12/14 1610    Subjective Caregiver states she's been changing wound as needed using medihoney sheet supplied by therapy.     Pain Assessment 0-10   Pain Score 0-No pain   Pressure Ulcer Properties Date First Assessed: 12/31/13 Time First Assessed: 0845 Location: Sacrum Staging: Stage III -  Full thickness tissue loss. Subcutaneous fat may be visible but bone, tendon or muscle are NOT exposed.   Dressing Type --  medihoney colloid sheet 2X2 and medipore tape   Dressing Changed   Dressing Change  Frequency PRN   State of Healing Early/partial granulation   Site / Wound Assessment Yellow;Red   % Wound base Red or Granulating 40%   % Wound base Yellow 60%   Peri-wound Assessment Pink   Drainage Amount Minimal   Drainage Description Serous   Treatment Cleansed;Debridement (Selective)   Selective Debridement - Location wound bed   Selective Debridement - Tools Used Scalpel   Selective Debridement - Tissue Removed slough, adherent yellow eschar   Wound Therapy - Clinical Statement Pt with continued adherent eschar in wound.  Able to thin with scapel, however unable to remove from woundbed.  Pt will continue to need debridment to remove tissue.   Factors Delaying/Impairing Wound Healing --   Hydrotherapy Plan --   Wound Therapy - Frequency --   Wound Therapy - Current Recommendations --   Wound Plan continue with wound care pt maybe able to be discharge next week.    Decrease Necrotic Tissue to 0   Decrease Necrotic Tissue - Progress Progressing toward goal   Increase Granulation Tissue to 100   Increase Granulation Tissue - Progress Progressing toward goal   Decrease Length/Width/Depth by (cm) 1.0 cm   Decrease Length/Width/Depth - Progress Progressing toward goal   Improve Drainage Characteristics Other (comment)   Improve Drainage Characteristics - Progress Progressing toward goal  Patient/Family will be able to  self dress until totally healed   Patient/Family Instruction Goal - Progress Met   Additional Wound Therapy Goal Pt to be able to sit with comfort   Additional Wound Therapy Goal - Progress Progressing toward goal        Teena Irani, PTA/CLT [phone] 01/12/2014, 4:17 PM

## 2014-01-14 ENCOUNTER — Ambulatory Visit (HOSPITAL_COMMUNITY)
Admission: RE | Admit: 2014-01-14 | Discharge: 2014-01-14 | Disposition: A | Discharge status: Home / Self Care | Payer: Medicare Other | Source: Ambulatory Visit | Attending: Internal Medicine | Admitting: Internal Medicine

## 2014-01-14 DIAGNOSIS — Z5189 Encounter for other specified aftercare: Secondary | ICD-10-CM | POA: Diagnosis not present

## 2014-01-14 DIAGNOSIS — L8993 Pressure ulcer of unspecified site, stage 3: Secondary | ICD-10-CM

## 2014-01-14 NOTE — Therapy (Signed)
Wound Care Therapy  Patient Details  Name: Travis Phillips MRN: [id number] Date of Birth: June 04, 1955  Encounter Date: 01/14/2014      PT End of Session - 01/14/14 1022    Visit Number 5   Number of Visits 8   Date for PT Re-Evaluation 01/30/14   Authorization Type [employer] Medicare   Authorization - Visit Number 5   Authorization - Number of Visits 8   PT Start Time 0932   PT Stop Time 1000   PT Time Calculation (min) 28 min   Activity Tolerance Patient tolerated treatment well   Behavior During Therapy [facility] for tasks assessed/performed      Past Medical History  Diagnosis Date  . Hypertension   . Dizziness   . Headache(784.0)   . Spasticity   . Hypercholesteremia   . Acid reflux   . Superficial siderosis of central nervous system 2012    Past Surgical History  Procedure Laterality Date  . Spinal tumor     . Hernia repair      There were no vitals taken for this visit.  Visit Diagnosis:  Pressure ulcer stage III   Problem List Patient Active Problem List   Diagnosis Date Noted  . HYPERLIPIDEMIA-MIXED 11/28/2008  . HYPERTENSION, UNSPECIFIED 11/28/2008  . CAD, NATIVE VESSEL 11/28/2008  . MURMUR 11/28/2008  . CONSTIPATION 11/26/2008  . HEMATOCHEZIA 09/08/2008  . WEIGHT LOSS, ABNORMAL 09/08/2008  . NAUSEA, CHRONIC 09/08/2008  . CHANGE IN BOWELS 09/08/2008  . ABDOMINAL PAIN OTHER SPECIFIED SITE 09/08/2008  . THYROID NODULE, HX OF 09/08/2008             Wound Therapy - 01/14/14 1004    Subjective Caregiver states she's been changing wound as needed using medihoney sheet supplied by therapy.     Pain Assessment 0-10   Pain Score 0-No pain   Pressure Ulcer Properties Date First Assessed: 12/31/13 Time First Assessed: 0845 Location: Sacrum Staging: Stage III -  Full thickness tissue loss. Subcutaneous fat may be visible but bone, tendon or muscle are NOT exposed.   Dressing Type --  medihoney colloid sheet 2X2 and medipore tape   Dressing Changed   Dressing  Change Frequency PRN   State of Healing Early/partial granulation   Site / Wound Assessment Yellow;Red   % Wound base Red or Granulating 40%   % Wound base Yellow 40%   % Wound base Black 20%   Peri-wound Assessment Pink   Wound Length (cm) 2 cm   Wound Width (cm) 2 cm   Wound Depth (cm) 0.1 cm   Drainage Amount Minimal   Drainage Description Serous   Treatment Cleansed;Debridement (Selective)   Selective Debridement - Location wound bed   Selective Debridement - Tools Used Scalpel   Selective Debridement - Tissue Removed slough, adherent black and yellow eschar   Wound Therapy - Clinical Statement Most medial portion of wound with black eschar today.  Able to debride half of eschar using scapel.  Wound also measured with increase in size.  Emphasized importance of pressure relief in healing wound.  Spouse reported she would check into cushion to help decrease pressure.  Spouse is supportive and independ with dressing changes.     Wound Plan continue with wound care.  Dressing may need to be changed if not responding by next visit.   Decrease Necrotic Tissue to 0   Decrease Necrotic Tissue - Progress Progressing toward goal   Increase Granulation Tissue to 100   Increase Granulation Tissue -  Progress Progressing toward goal   Decrease Length/Width/Depth by (cm) 1.0 cm   Decrease Length/Width/Depth - Progress Progressing toward goal   Improve Drainage Characteristics Other (comment)   Improve Drainage Characteristics - Progress Progressing toward goal   Patient/Family will be able to  self dress until totally healed   Patient/Family Instruction Goal - Progress Met   Additional Wound Therapy Goal Pt to be able to sit with comfort   Additional Wound Therapy Goal - Progress Progressing toward goal          Teena Irani, PTA/CLT [phone] 01/14/2014, 10:23 AM

## 2014-01-19 ENCOUNTER — Ambulatory Visit (HOSPITAL_COMMUNITY)
Admission: RE | Admit: 2014-01-19 | Discharge: 2014-01-19 | Disposition: A | Discharge status: Home / Self Care | Payer: Medicare Other | Source: Ambulatory Visit | Attending: Family Medicine | Admitting: Family Medicine

## 2014-01-19 DIAGNOSIS — Z5189 Encounter for other specified aftercare: Secondary | ICD-10-CM | POA: Diagnosis not present

## 2014-01-19 DIAGNOSIS — L8993 Pressure ulcer of unspecified site, stage 3: Secondary | ICD-10-CM

## 2014-01-19 NOTE — Therapy (Signed)
Wound Care Therapy  Patient Details  Name: Travis Phillips MRN: [id number] Date of Birth: 06-11-1955  Encounter Date: 01/19/2014      PT End of Session - 01/19/14 1530    Visit Number 6   Number of Visits 8   Date for PT Re-Evaluation 01/30/14   Authorization Type [employer] Medicare   Authorization - Visit Number 6   Authorization - Number of Visits 8   PT Start Time 7829   PT Stop Time 1506   PT Time Calculation (min) 31 min   Activity Tolerance Patient tolerated treatment well      Past Medical History  Diagnosis Date  . Hypertension   . Dizziness   . Headache(784.0)   . Spasticity   . Hypercholesteremia   . Acid reflux   . Superficial siderosis of central nervous system 2012    Past Surgical History  Procedure Laterality Date  . Spinal tumor     . Hernia repair      There were no vitals taken for this visit.  Visit Diagnosis:  Pressure ulcer stage III         Wound Therapy - 01/19/14 1523    Subjective Caregiver states she's been changing wound as needed using medihoney sheet supplied by therapy.    Caregiver states that Travis Phillips has been trying to stay his b   Pain Assessment 0-10   Pain Score 0-No pain   Pressure Ulcer Properties Date First Assessed: 12/31/13 Time First Assessed: 0845 Location: Sacrum Staging: Stage III -  Full thickness tissue loss. Subcutaneous fat may be visible but bone, tendon or muscle are NOT exposed.   Dressing Type Silver hydrofiber  medihoney colloid sheet 2X2 and medipore tape   Dressing Changed   Dressing Change Frequency PRN   State of Healing Early/partial granulation   Site / Wound Assessment Yellow;Red   % Wound base Red or Granulating 40%   % Wound base Yellow 60%   % Wound base Black --   Peri-wound Assessment Maceration   Wound Length (cm) --  two wounds now one is .7 x.6 the other is .3x .4    Wound Depth (cm) 0.1 cm   Drainage Amount Minimal   Drainage Description Serous   Selective Debridement - Location wound  bed   Selective Debridement - Tools Used Scalpel   Selective Debridement - Tissue Removed slough,   Wound Therapy - Clinical Statement No black eschar noted, pt dressing changed to silver hydrofiber to attempt to decrease maceration and slough that continues to build back up.     Factors Delaying/Impairing Wound Healing Diabetes Mellitus;Immobility;Multiple medical problems;Polypharmacy   Hydrotherapy Plan Debridement;Dressing change;Patient/family education   Wound Plan continue with debridement; stress the importance of keeping off wound, assess how silver dressing does.   Decrease Necrotic Tissue to 0   Increase Granulation Tissue to 100   Decrease Length/Width/Depth by (cm) 1.0 cm   Improve Drainage Characteristics Other (comment)   Patient/Family will be able to  self dress until totally healed   Additional Wound Therapy Goal Pt to be able to sit with comfort         Problem List Patient Active Problem List   Diagnosis Date Noted  . HYPERLIPIDEMIA-MIXED 11/28/2008  . HYPERTENSION, UNSPECIFIED 11/28/2008  . CAD, NATIVE VESSEL 11/28/2008  . MURMUR 11/28/2008  . CONSTIPATION 11/26/2008  . HEMATOCHEZIA 09/08/2008  . WEIGHT LOSS, ABNORMAL 09/08/2008  . NAUSEA, CHRONIC 09/08/2008  . CHANGE IN BOWELS 09/08/2008  . ABDOMINAL  PAIN OTHER SPECIFIED SITE 09/08/2008  . THYROID NODULE, HX OF 09/08/2008        Azucena Freed PT/CLT [phone]  01/19/2014, 3:32 PM

## 2014-01-22 ENCOUNTER — Ambulatory Visit (HOSPITAL_COMMUNITY)
Admission: RE | Admit: 2014-01-22 | Discharge: 2014-01-22 | Disposition: A | Discharge status: Home / Self Care | Payer: Medicare Other | Source: Ambulatory Visit | Attending: Family Medicine | Admitting: Family Medicine

## 2014-01-22 DIAGNOSIS — Z5189 Encounter for other specified aftercare: Secondary | ICD-10-CM | POA: Insufficient documentation

## 2014-01-22 DIAGNOSIS — L89153 Pressure ulcer of sacral region, stage 3: Secondary | ICD-10-CM | POA: Insufficient documentation

## 2014-01-22 DIAGNOSIS — E119 Type 2 diabetes mellitus without complications: Secondary | ICD-10-CM | POA: Insufficient documentation

## 2014-01-22 DIAGNOSIS — L8993 Pressure ulcer of unspecified site, stage 3: Secondary | ICD-10-CM

## 2014-01-22 NOTE — Therapy (Signed)
[facility] [address] [place], [place], [address] Phone: [phone]   Fax:  [phone]  Wound Care Therapy  Patient Details  Name: Travis Phillips MRN: [id number] Date of Birth: 07/10/1955  Encounter Date: 01/22/2014      PT End of Session - 01/22/14 1343    Visit Number 7   Number of Visits 8   Date for PT Re-Evaluation 01/30/14   Authorization Type [employer] Medicare   Authorization - Visit Number 7   Authorization - Number of Visits 8   PT Start Time 4503   PT Stop Time 1330   PT Time Calculation (min) 25 min   Activity Tolerance Patient tolerated treatment well      Past Medical History  Diagnosis Date  . Hypertension   . Dizziness   . Headache(784.0)   . Spasticity   . Hypercholesteremia   . Acid reflux   . Superficial siderosis of central nervous system 2012    Past Surgical History  Procedure Laterality Date  . Spinal tumor     . Hernia repair      There were no vitals taken for this visit.  Visit Diagnosis:  Pressure ulcer stage III         Wound Therapy - 01/22/14 1324    Subjective Caregiver states she's been changing wound as needed using medihoney sheet supplied by therapy.    Caregiver states that Mr. Wulff has been trying to stay his b   Pressure Ulcer Properties Date First Assessed: 12/31/13 Time First Assessed: 0845 Location: Sacrum Staging: Stage III -  Full thickness tissue loss. Subcutaneous fat may be visible but bone, tendon or muscle are NOT exposed.   Dressing Type Silver hydrofiber   Dressing Changed   Dressing Change Frequency PRN   State of Healing Early/partial granulation   Site / Wound Assessment Yellow;Red   % Wound base Red or Granulating 40%   % Wound base Yellow 60%   Peri-wound Assessment Intact;Erythema (blanchable)   Drainage Amount Minimal   Drainage Description Serous   Treatment Cleansed;Debridement (Selective)   Selective Debridement - Location wound bed   Selective Debridement -  Tools Used Scalpel   Selective Debridement - Tissue Removed slough,   Wound Therapy - Clinical Statement Bladed thick eschar in superior wound bed; able to remove all slough from inferior wound.  Spouse concerned with cost of therapy and requested to decrease to 1X week as she is doing dressing changes at home and only needs therapy for debridment.  Continued with silver hydrofiber per PT change in plan last visit.   Factors Delaying/Impairing Wound Healing Diabetes Mellitus;Immobility;Multiple medical problems;Polypharmacy   Hydrotherapy Plan Debridement;Dressing change;Patient/family education   Wound Plan continue with debridement decreasing to 1X week.   Decrease Necrotic Tissue to 0   Decrease Necrotic Tissue - Progress Progressing toward goal   Increase Granulation Tissue to 100   Increase Granulation Tissue - Progress Progressing toward goal   Decrease Length/Width/Depth by (cm) 1.0 cm   Decrease Length/Width/Depth - Progress Progressing toward goal   Improve Drainage Characteristics Other (comment)   Improve Drainage Characteristics - Progress Progressing toward goal   Patient/Family will be able to  self dress until totally healed   Patient/Family Instruction Goal - Progress Met   Additional Wound Therapy Goal Pt to be able to sit with comfort   Additional Wound Therapy Goal - Progress Progressing toward goal  Problem List Patient Active Problem List   Diagnosis Date Noted  . HYPERLIPIDEMIA-MIXED 11/28/2008  . HYPERTENSION, UNSPECIFIED 11/28/2008  . CAD, NATIVE VESSEL 11/28/2008  . MURMUR 11/28/2008  . CONSTIPATION 11/26/2008  . HEMATOCHEZIA 09/08/2008  . WEIGHT LOSS, ABNORMAL 09/08/2008  . NAUSEA, CHRONIC 09/08/2008  . CHANGE IN BOWELS 09/08/2008  . ABDOMINAL PAIN OTHER SPECIFIED SITE 09/08/2008  . THYROID NODULE, HX OF 09/08/2008    Teena Irani, PTA/CLT [phone] 01/22/2014, 1:44  PM

## 2014-01-28 ENCOUNTER — Ambulatory Visit (HOSPITAL_COMMUNITY)
Admission: RE | Admit: 2014-01-28 | Discharge: 2014-01-28 | Disposition: A | Discharge status: Home / Self Care | Payer: Medicare Other | Source: Ambulatory Visit | Attending: Family Medicine | Admitting: Family Medicine

## 2014-01-28 DIAGNOSIS — Z5189 Encounter for other specified aftercare: Secondary | ICD-10-CM | POA: Diagnosis not present

## 2014-01-28 DIAGNOSIS — L8993 Pressure ulcer of unspecified site, stage 3: Secondary | ICD-10-CM

## 2014-01-28 NOTE — Therapy (Signed)
[facility] [address] [place], [place], [address] Phone: [phone]   Fax:  [phone]  Physical Therapy Re-evaluation  Patient Details  Name: Travis Phillips MRN: [id number] Date of Birth: 31-Jul-1955  Encounter Date: 01/28/2014      PT End of Session - 01/28/14 1122    Visit Number 8   Number of Visits 12   Date for PT Re-Evaluation 01/30/14   Authorization Type [employer] Medicare   Authorization - Visit Number 8   Authorization - Number of Visits 18   PT Start Time 4287   PT Stop Time 1110   PT Time Calculation (min) 18 min   Activity Tolerance Patient tolerated treatment well      Past Medical History  Diagnosis Date  . Hypertension   . Dizziness   . Headache(784.0)   . Spasticity   . Hypercholesteremia   . Acid reflux   . Superficial siderosis of central nervous system 2012    Past Surgical History  Procedure Laterality Date  . Spinal tumor     . Hernia repair      There were no vitals taken for this visit.  Visit Diagnosis:  Pressure ulcer stage III         Wound Therapy - 01/28/14 1113    Subjective Wife continues to care for wound when dressings need changing.  Pt also reports she is encouraging him to walk and stay off his bottom more to promote healing.     Pain Assessment 0-10   Pain Score 0-No pain   Pressure Ulcer Properties Date First Assessed: 12/31/13 Time First Assessed: 0845 Location: Sacrum Staging: Stage III -  Full thickness tissue loss. Subcutaneous fat may be visible but bone, tendon or muscle are NOT exposed.   Dressing Type Silver hydrofiber   Dressing Changed   Dressing Change Frequency PRN   State of Healing Early/partial granulation   Site / Wound Assessment Yellow;Red   % Wound base Red or Granulating 40%   % Wound base Yellow 60%   Peri-wound Assessment Intact;Erythema (blanchable)   Wound Length (cm) Now 2 wounds- most superior wound 0.7X0.6cm, inferior 0.3X0.4 cm (was 2X2cm one large wound)   Wound Depth (cm) 0.1 cm  was 0.1cm   Drainage Amount Minimal   Drainage Description Serous   Treatment Cleansed;Debridement (Selective)   Selective Debridement - Location wound bed   Selective Debridement - Tools Used Scalpel   Selective Debridement - Tissue Removed slough,   Wound Therapy - Clinical Statement smaller wound covered with 100% scab.  Removed and still not quite healed underneath.  Top wound again bladed and scraped with scapel to remove adherent slough.  Overall reduced in size, however slowly approximating    Factors Delaying/Impairing Wound Healing Diabetes Mellitus;Immobility;Multiple medical problems;Polypharmacy   Hydrotherapy Plan Debridement;Dressing change;Patient/family education   Wound Plan continue with debridement decreasing to 1X week.   Dressing  Recommend continuation 1X week for 4 more weeks.   Decrease Necrotic Tissue to 0   Decrease Necrotic Tissue - Progress Progressing toward goal   Increase Granulation Tissue to 100   Increase Granulation Tissue - Progress Progressing toward goal   Decrease Length/Width/Depth by (cm) 1.0 cm   Decrease Length/Width/Depth - Progress Progressing toward goal   Improve Drainage Characteristics Other (comment)   Improve Drainage Characteristics - Progress Progressing toward goal   Patient/Family will be able to  self dress until totally healed   Patient/Family Instruction Goal - Progress Met   Additional  Wound Therapy Goal Pt to be able to sit with comfort   Additional Wound Therapy Goal - Progress Progressing toward goal        [id number] CJ [id number] CI           Problem List Patient Active Problem List   Diagnosis Date Noted  . HYPERLIPIDEMIA-MIXED 11/28/2008  . HYPERTENSION, UNSPECIFIED 11/28/2008  . CAD, NATIVE VESSEL 11/28/2008  . MURMUR 11/28/2008  . CONSTIPATION 11/26/2008  . HEMATOCHEZIA 09/08/2008  . WEIGHT LOSS, ABNORMAL 09/08/2008  . NAUSEA, CHRONIC 09/08/2008  . CHANGE IN BOWELS 09/08/2008  .  ABDOMINAL PAIN OTHER SPECIFIED SITE 09/08/2008  . THYROID NODULE, HX OF 09/08/2008    Teena Irani, PTA/CLT [phone] 01/28/2014, 1:16 PM  Azucena Freed PT/CLT [phone]

## 2014-01-30 ENCOUNTER — Ambulatory Visit (HOSPITAL_COMMUNITY): Payer: Medicare Other | Admitting: Physical Therapy

## 2014-02-03 ENCOUNTER — Ambulatory Visit (HOSPITAL_COMMUNITY)
Admission: RE | Admit: 2014-02-03 | Discharge: 2014-02-03 | Disposition: A | Discharge status: Home / Self Care | Payer: Medicare Other | Source: Ambulatory Visit | Attending: Family Medicine | Admitting: Family Medicine

## 2014-02-03 DIAGNOSIS — L8993 Pressure ulcer of unspecified site, stage 3: Secondary | ICD-10-CM

## 2014-02-03 DIAGNOSIS — Z5189 Encounter for other specified aftercare: Secondary | ICD-10-CM | POA: Diagnosis not present

## 2014-02-03 NOTE — Therapy (Signed)
[facility] [address] [place], [place], [address] Phone: [phone]   Fax:  [phone]  Wound Care Therapy  Patient Details  Name: Travis Phillips MRN: [id number] Date of Birth: 24-Jan-1956  Encounter Date: 02/03/2014      PT End of Session - 02/03/14 1339    Visit Number 9   Number of Visits 12   Date for PT Re-Evaluation 01/30/14   Authorization Type [employer] Medicare   Authorization - Visit Number 9   Authorization - Number of Visits 18   PT Start Time 0930   PT Stop Time 0955   PT Time Calculation (min) 25 min   Activity Tolerance Patient tolerated treatment well      Past Medical History  Diagnosis Date  . Hypertension   . Dizziness   . Headache(784.0)   . Spasticity   . Hypercholesteremia   . Acid reflux   . Superficial siderosis of central nervous system 2012    Past Surgical History  Procedure Laterality Date  . Spinal tumor     . Hernia repair      There were no vitals taken for this visit.  Visit Diagnosis:  No diagnosis found.         Wound Therapy - 02/03/14 1334    Subjective Wife continues to care for wound when dressings need changing.  Pt also reports she is encouraging him to walk and stay off his bottom more to promote healing.  Caregiver states that [name] has been trying to stay his b   Pressure Ulcer Properties Date First Assessed: 12/31/13 Time First Assessed: 0845 Location: Sacrum Staging: Stage III -  Full thickness tissue loss. Subcutaneous fat may be visible but bone, tendon or muscle are NOT exposed.   Dressing Type Silver hydrofiber   Dressing Changed   Dressing Change Frequency PRN   State of Healing Early/partial granulation   Site / Wound Assessment Yellow;Red   % Wound base Red or Granulating 50%   % Wound base Yellow 50%   Peri-wound Assessment Intact;Erythema (blanchable)   Wound Length (cm) 1 cm   Wound Width (cm) 1.5 cm   Wound Depth (cm) 0.1 cm   Drainage Amount Minimal   Drainage  Description Serous   Treatment Cleansed;Debridement (Selective)   Selective Debridement - Location wound bed   Selective Debridement - Tools Used Scalpel   Selective Debridement - Tissue Removed adherent slough   Wound Therapy - Clinical Statement most distal portion of wound is now 100% granulated.  Superior portion remains with thick eschar.  Able to remove majority around borders using scapel.  pt tolerates debridement well.  Will continue with silver hydrofiber dressing.   Factors Delaying/Impairing Wound Healing Diabetes Mellitus;Immobility;Multiple medical problems;Polypharmacy   Hydrotherapy Plan Debridement;Dressing change;Patient/family education   Wound Plan continue with debridement 1X week.  continue one time a week for 4 weeks    Dressing  silver hydrofiber   Dressing 2X2, medipore   Decrease Necrotic Tissue to 0   Decrease Necrotic Tissue - Progress Progressing toward goal   Increase Granulation Tissue to 100   Increase Granulation Tissue - Progress Progressing toward goal   Decrease Length/Width/Depth by (cm) 1.0 cm   Decrease Length/Width/Depth - Progress Progressing toward goal   Improve Drainage Characteristics Other (comment)   Improve Drainage Characteristics - Progress Progressing toward goal   Patient/Family will be able to  self dress until totally healed   Patient/Family Instruction Goal - Progress Met   Additional  Wound Therapy Goal Pt to be able to sit with comfort   Additional Wound Therapy Goal - Progress Progressing toward goal   Goals/treatment plan/discharge plan were made with and agreed upon by patient/family Yes   Time For Goal Achievement --  see 1 x week x 4 weeks            Problem List Patient Active Problem List   Diagnosis Date Noted  . HYPERLIPIDEMIA-MIXED 11/28/2008  . HYPERTENSION, UNSPECIFIED 11/28/2008  . CAD, NATIVE VESSEL 11/28/2008  . MURMUR 11/28/2008  . CONSTIPATION 11/26/2008  . HEMATOCHEZIA 09/08/2008  . WEIGHT LOSS,  ABNORMAL 09/08/2008  . NAUSEA, CHRONIC 09/08/2008  . CHANGE IN BOWELS 09/08/2008  . ABDOMINAL PAIN OTHER SPECIFIED SITE 09/08/2008  . THYROID NODULE, HX OF 09/08/2008    Teena Irani, PTA/CLT [phone] 02/03/2014, 1:40 PM

## 2014-02-06 ENCOUNTER — Ambulatory Visit (HOSPITAL_COMMUNITY): Payer: Medicare Other | Admitting: Physical Therapy

## 2014-02-11 ENCOUNTER — Ambulatory Visit (HOSPITAL_COMMUNITY)
Admission: RE | Admit: 2014-02-11 | Discharge: 2014-02-11 | Disposition: A | Discharge status: Home / Self Care | Payer: Medicare Other | Source: Ambulatory Visit | Attending: Family Medicine | Admitting: Family Medicine

## 2014-02-11 DIAGNOSIS — L8993 Pressure ulcer of unspecified site, stage 3: Secondary | ICD-10-CM

## 2014-02-11 DIAGNOSIS — Z5189 Encounter for other specified aftercare: Secondary | ICD-10-CM | POA: Diagnosis not present

## 2014-02-11 NOTE — Therapy (Signed)
[facility] [address] [place], [place], [address] Phone: [phone]   Fax:  [phone]  Wound Care Therapy  Patient Details  Name: Travis Phillips MRN: [id number] Date of Birth: Nov 17, 1955  Encounter Date: 02/11/2014      PT End of Session - 02/11/14 1143    Visit Number 10   Number of Visits 12   Date for PT Re-Evaluation 02/25/14   Authorization Type [employer] Medicare   Authorization - Visit Number 10   Authorization - Number of Visits 18   PT Start Time 1105   PT Stop Time 1130   PT Time Calculation (min) 25 min   Activity Tolerance Patient tolerated treatment well   Behavior During Therapy [facility] for tasks assessed/performed      Past Medical History  Diagnosis Date  . Hypertension   . Dizziness   . Headache(784.0)   . Spasticity   . Hypercholesteremia   . Acid reflux   . Superficial siderosis of central nervous system 2012    Past Surgical History  Procedure Laterality Date  . Spinal tumor     . Hernia repair      There were no vitals taken for this visit.  Visit Diagnosis:  Pressure ulcer stage III                 Wound Therapy - 02/11/14 1135    Subjective Wife states patient has a urology appointment next month.  States she got him a cushion to sit on , however she has been unable to get him to increase his activity level and stay off bottom more.  Wife also reports there is a new area superior to original wound that appears to have occurred from tape removal by patient.     Pain Assessment No/denies pain   Pressure Ulcer Properties Date First Assessed: 12/31/13 Time First Assessed: 0845 Location: Sacrum Staging: Stage III -  Full thickness tissue loss. Subcutaneous fat may be visible but bone, tendon or muscle are NOT exposed.   Dressing Type Silver hydrofiber   Dressing Changed   Dressing Change Frequency PRN   State of Healing Early/partial granulation   Site / Wound Assessment Yellow;Red   %  Wound base Red or Granulating 50%   % Wound base Yellow 50%   Peri-wound Assessment Intact;Erythema (blanchable)   Wound Length (cm) 1 cm   Wound Width (cm) 1.5 cm   Wound Depth (cm) 0.1 cm   Drainage Amount Minimal   Drainage Description Serous   Treatment Cleansed;Debridement (Selective)   Wound Properties Date First Assessed: 02/11/14 Time First Assessed: 1110 Wound Type: Other (Comment) Location: Buttocks Location Orientation: Right Wound Description (Comments): superior to initial wound Present on Admission: No   Dressing Type Silver hydrofiber   Dressing Changed New   Dressing Status Clean;Dry;Intact   Site / Wound Assessment Red   % Wound base Red or Granulating 100%   % Wound base Yellow 0%   Peri-wound Assessment Maceration;Erythema (blanchable)   Wound Length (cm) 0.5 cm   Wound Width (cm) 0.3 cm   Wound Depth (cm) 0 cm   Margins Attached edges (approximated)   Drainage Amount Minimal   Drainage Description Serous   Treatment Cleansed   Selective Debridement - Location wound bed   Selective Debridement - Tools Used Scalpel   Selective Debridement - Tissue Removed adherent slough   Wound Therapy - Clinical Statement Continued removal of thick eschar from wound.  Wound remeasured with  no change in size from last measurement.  New area measured at [facility] with 100% granulation.  appears to be result of tape tearing away skin.  Encouraged wife to discuss importance of patient not to remove bandage himself to preserve surrounding intact skin. as well as increasing his activity level.  Wife verbalized understanding.     Wound Plan continue with debridement 1X week.   Dressing  silver hydrofiber   Dressing 2X2, medipore   Decrease Necrotic Tissue to 0   Decrease Necrotic Tissue - Progress Progressing toward goal   Increase Granulation Tissue to 100   Increase Granulation Tissue - Progress Progressing toward goal   Decrease Length/Width/Depth by (cm) 1.0 cm   Decrease  Length/Width/Depth - Progress Progressing toward goal   Improve Drainage Characteristics Other (comment)   Improve Drainage Characteristics - Progress Progressing toward goal   Patient/Family will be able to  self dress until totally healed   Patient/Family Instruction Goal - Progress Met   Additional Wound Therapy Goal Pt to be able to sit with comfort   Additional Wound Therapy Goal - Progress Progressing toward goal   Goals/treatment plan/discharge plan were made with and agreed upon by patient/family Yes             PT Short Term Goals - 01/08/14 1541    PT SHORT TERM GOAL #1   Title decrease necrotic tissue to 0   Time 2   Status On-going   PT SHORT TERM GOAL #2   Title Granulation tissue to be increased to 100%   Time 2   Period Weeks   Status On-going           PT Long Term Goals - 01/08/14 1541    PT LONG TERM GOAL #1   Title Wound size decreased by 1.0 cm diameter   Time 4   Status On-going   PT LONG TERM GOAL #2   Title Pt famility to verbalize importance of cleansing and changing dressing on a regular basis    Time 4   Status Achieved   PT LONG TERM GOAL #3   Title no pain with sitting   Time 4   Status On-going          Problem List Patient Active Problem List   Diagnosis Date Noted  . HYPERLIPIDEMIA-MIXED 11/28/2008  . HYPERTENSION, UNSPECIFIED 11/28/2008  . CAD, NATIVE VESSEL 11/28/2008  . MURMUR 11/28/2008  . CONSTIPATION 11/26/2008  . HEMATOCHEZIA 09/08/2008  . WEIGHT LOSS, ABNORMAL 09/08/2008  . NAUSEA, CHRONIC 09/08/2008  . CHANGE IN BOWELS 09/08/2008  . ABDOMINAL PAIN OTHER SPECIFIED SITE 09/08/2008  . THYROID NODULE, HX OF 09/08/2008    Teena Irani, PTA/CLT [phone] 02/11/2014, 11:44 AM  [place], [place], [address] Phone: [phone]   Fax:  [phone]

## 2014-02-25 ENCOUNTER — Ambulatory Visit (HOSPITAL_COMMUNITY)
Admission: RE | Admit: 2014-02-25 | Discharge: 2014-02-25 | Disposition: A | Discharge status: Home / Self Care | Payer: Medicare Other | Source: Ambulatory Visit | Attending: Family Medicine | Admitting: Family Medicine

## 2014-02-25 DIAGNOSIS — L8993 Pressure ulcer of unspecified site, stage 3: Secondary | ICD-10-CM

## 2014-02-25 DIAGNOSIS — Z5189 Encounter for other specified aftercare: Secondary | ICD-10-CM | POA: Insufficient documentation

## 2014-02-25 DIAGNOSIS — L89153 Pressure ulcer of sacral region, stage 3: Secondary | ICD-10-CM | POA: Insufficient documentation

## 2014-02-25 DIAGNOSIS — E119 Type 2 diabetes mellitus without complications: Secondary | ICD-10-CM | POA: Insufficient documentation

## 2014-02-25 NOTE — Therapy (Signed)
[place], [place], [address] Phone: [phone]   Fax:  [phone]  Wound Care Therapy  Patient Details  Name: KERRON SEDANO MRN: [id number] Date of Birth: [birth year]  Encounter Date: 02/25/2014      PT End of Session - 02/25/14 1421    Visit Number 11   Number of Visits 12   Date for PT Re-Evaluation 02/25/14   Authorization Type U[employer] Medicare   Authorization - Visit Number 11   Authorization - Number of Visits 18   Activity Tolerance Patient tolerated treatment well   Behavior During Therapy [facility] for tasks assessed/performed      Past Medical History  Diagnosis Date  . Hypertension   . Dizziness   . Headache(784.0)   . Spasticity   . Hypercholesteremia   . Acid reflux   . Superficial siderosis of central nervous system 2012    Past Surgical History  Procedure Laterality Date  . Spinal tumor     . Hernia repair      There were no vitals taken for this visit.  Visit Diagnosis:  Pressure ulcer stage III                 Wound Therapy - 02/25/14 1416    Subjective Pt states patients urinary urgency is becoming worse, hardly able to rest last night.  Pt reports the area above the original spot is not getting better.    Pressure Ulcer Properties Date First Assessed: 12/31/13 Time First Assessed: 0845 Location: Sacrum Staging: Stage III -  Full thickness tissue loss. Subcutaneous fat may be visible but bone, tendon or muscle are NOT exposed.   Dressing Type Impregnated gauze (bismuth)   Dressing Changed   Dressing Change Frequency PRN   State of Healing Early/partial granulation   Site / Wound Assessment Yellow;Red   % Wound base Red or Granulating 50%   % Wound base Yellow 50%   Peri-wound Assessment Intact;Erythema (blanchable)   Drainage Amount Minimal   Drainage Description Serous   Treatment Cleansed;Debridement (Selective)   Wound Properties Date First Assessed: 02/11/14 Time  First Assessed: 1110 Wound Type: Other (Comment) Location: Buttocks Location Orientation: Right Wound Description (Comments): superior to initial wound Present on Admission: No   Dressing Type Impregnated gauze (bismuth)   Dressing Changed Changed   Dressing Status Clean;Dry;Intact   Site / Wound Assessment Red   % Wound base Red or Granulating 50%   % Wound base Yellow 50%   Peri-wound Assessment Maceration;Erythema (blanchable)   Margins Attached edges (approximated)   Drainage Amount Minimal   Drainage Description Serous   Treatment Cleansed;Debridement (Selective)   Selective Debridement - Location wound bed   Selective Debridement - Tools Used Scalpel   Selective Debridement - Tissue Removed adherent slough   Wound Therapy - Clinical Statement bladed eschar with scapel.  Continues to have thick layer of eschar covering wounds.  Noted irritation from tape arount wound.  Encouraged wife to keep moving tape around to decrease irritation.  Changed dressing to xeroform today to see if better response.  Encouraged patient to contact urologist to try and move appointment up.  Pt is using a pressure pad when sitting and is walking more than previously.     Wound Plan continue with debridement 1X week.   Dressing  xeroform, ABD, medipore   Dressing --   Decrease Necrotic Tissue to 0   Increase Granulation Tissue to 100   Decrease Length/Width/Depth by (  cm) 1.0 cm   Improve Drainage Characteristics Other (comment)   Patient/Family will be able to  self dress until totally healed   Additional Wound Therapy Goal Pt to be able to sit with comfort   Goals/treatment plan/discharge plan were made with and agreed upon by patient/family Yes            PT Short Term Goals - 01/08/14 1541    PT SHORT TERM GOAL #1   Title decrease necrotic tissue to 0   Time 2   Status On-going   PT SHORT TERM GOAL #2   Title Granulation tissue to be increased to 100%   Time 2   Period Weeks   Status  On-going           PT Long Term Goals - 01/08/14 1541    PT LONG TERM GOAL #1   Title Wound size decreased by 1.0 cm diameter   Time 4   Status On-going   PT LONG TERM GOAL #2   Title Pt famility to verbalize importance of cleansing and changing dressing on a regular basis    Time 4   Status Achieved   PT LONG TERM GOAL #3   Title no pain with sitting   Time 4   Status On-going         Problem List Patient Active Problem List   Diagnosis Date Noted  . HYPERLIPIDEMIA-MIXED 11/28/2008  . HYPERTENSION, UNSPECIFIED 11/28/2008  . CAD, NATIVE VESSEL 11/28/2008  . MURMUR 11/28/2008  . CONSTIPATION 11/26/2008  . HEMATOCHEZIA 09/08/2008  . WEIGHT LOSS, ABNORMAL 09/08/2008  . NAUSEA, CHRONIC 09/08/2008  . CHANGE IN BOWELS 09/08/2008  . ABDOMINAL PAIN OTHER SPECIFIED SITE 09/08/2008  . THYROID NODULE, HX OF 09/08/2008    Teena Irani, PTA/CLT [phone] 02/25/2014, 2:22 PM  [place] [address] [place], [place], [address] Phone: [phone]   Fax:  [phone]

## 2014-03-04 ENCOUNTER — Ambulatory Visit (HOSPITAL_COMMUNITY)
Admission: RE | Admit: 2014-03-04 | Discharge: 2014-03-04 | Disposition: A | Discharge status: Home / Self Care | Payer: Medicare Other | Source: Ambulatory Visit | Attending: Family Medicine | Admitting: Family Medicine

## 2014-03-04 DIAGNOSIS — L8993 Pressure ulcer of unspecified site, stage 3: Secondary | ICD-10-CM

## 2014-03-04 NOTE — Therapy (Signed)
[facility] [address] [place], [place], [address] Phone: [phone]   Fax:  [phone]  Physical Therapy Re-evaluation  Patient Details  Name: ELMON SHADER MRN: [id number] Date of Birth: [birth year] Referring Provider:  Elfredia Nevins, MD  Encounter Date: 03/04/2014      PT End of Session - 03/04/14 1055    Visit Number 12   Number of Visits 20   Date for PT Re-Evaluation 04/22/14   Authorization Type [employer] Medicare   Authorization - Visit Number 12   Authorization - Number of Visits 18   PT Start Time 1020   PT Stop Time 1045   PT Time Calculation (min) 25 min   Activity Tolerance Patient tolerated treatment well   Behavior During Therapy [facility] for tasks assessed/performed      Past Medical History  Diagnosis Date  . Hypertension   . Dizziness   . Headache(784.0)   . Spasticity   . Hypercholesteremia   . Acid reflux   . Superficial siderosis of central nervous system 2012    Past Surgical History  Procedure Laterality Date  . Spinal tumor     . Hernia repair      There were no vitals taken for this visit.  Visit Diagnosis:  Pressure ulcer stage III                 Wound Therapy - 03/04/14 1044    Subjective Pt's spouse states she thinks it's getting better,however having a hard time with the tape sticking lately.   Pain Assessment No/denies pain   Pressure Ulcer Properties Date First Assessed: 12/31/13 Time First Assessed: 0845 Location: Sacrum Staging: Stage III -  Full thickness tissue loss. Subcutaneous fat may be visible but bone, tendon or muscle are NOT exposed.   Dressing Type Impregnated gauze (bismuth)   Dressing Changed   Dressing Change Frequency PRN   State of Healing Early/partial granulation   Site / Wound Assessment Yellow;Red   % Wound base Red or Granulating 60%   % Wound base Yellow 40%   Peri-wound Assessment Intact;Erythema (blanchable)   Wound Length (cm) 1 cm   Wound Width  (cm) 1.2 cm   Wound Depth (cm) 0.1 cm   Drainage Amount Minimal   Drainage Description Serous   Treatment Cleansed;Debridement (Selective)   Wound Properties Date First Assessed: 02/11/14 Time First Assessed: 1110 Wound Type: Other (Comment) Location: Buttocks Location Orientation: Right Wound Description (Comments): superior to initial wound Present on Admission: No   Dressing Type Impregnated gauze (bismuth)   Dressing Changed Changed   Dressing Status Clean;Dry;Intact   Site / Wound Assessment Red   % Wound base Red or Granulating 60%   % Wound base Yellow 40%   Peri-wound Assessment Maceration;Erythema (blanchable)   Wound Length (cm) 0.4 cm   Wound Width (cm) 0.2 cm   Wound Depth (cm) 0 cm   Margins Attached edges (approximated)   Drainage Amount Minimal   Drainage Description Serous   Treatment Cleansed;Debridement (Selective)   Selective Debridement - Location wound bed   Selective Debridement - Tools Used Scalpel   Selective Debridement - Tissue Removed adherent slough   Wound Therapy - Clinical Statement Able to remove more slough today.  Superior wound approximating well.  Inferior wound with calloused border; scapeled some of the edges away.  Appears to be doing better with xeroform dressing.     Factors Delaying/Impairing Wound Healing Diabetes Mellitus;Immobility;Multiple medical problems;Polypharmacy   Hydrotherapy Plan  Debridement;Dressing change;Patient/family education   Wound Therapy - Frequency Other (comment)  1 X week   Wound Plan continue with debridement 1X week.   Dressing  xeroform, ABD, medipore   Decrease Necrotic Tissue to 0   Decrease Necrotic Tissue - Progress Progressing toward goal   Increase Granulation Tissue to 100   Increase Granulation Tissue - Progress Progressing toward goal   Decrease Length/Width/Depth by (cm) 1.0 cm   Decrease Length/Width/Depth - Progress Progressing toward goal   Improve Drainage Characteristics Other (comment)    Improve Drainage Characteristics - Progress Progressing toward goal   Patient/Family will be able to  self dress until totally healed   Patient/Family Instruction Goal - Progress Met   Additional Wound Therapy Goal Pt to be able to sit with comfort   Additional Wound Therapy Goal - Progress Met   Goals/treatment plan/discharge plan were made with and agreed upon by patient/family Yes                   PT Short Term Goals - 01/08/14 1541    PT SHORT TERM GOAL #1   Title decrease necrotic tissue to 0   Time 2   Status On-going   PT SHORT TERM GOAL #2   Title Granulation tissue to be increased to 100%   Time 2   Period Weeks   Status On-going           PT Long Term Goals - 01/08/14 1541    PT LONG TERM GOAL #1   Title Wound size decreased by 1.0 cm diameter   Time 4   Status On-going   PT LONG TERM GOAL #2   Title Pt famility to verbalize importance of cleansing and changing dressing on a regular basis    Time 4   Status Achieved   PT LONG TERM GOAL #3   Title no pain with sitting   Time 4   Status On-going         Problem List Patient Active Problem List   Diagnosis Date Noted  . HYPERLIPIDEMIA-MIXED 11/28/2008  . HYPERTENSION, UNSPECIFIED 11/28/2008  . CAD, NATIVE VESSEL 11/28/2008  . MURMUR 11/28/2008  . CONSTIPATION 11/26/2008  . HEMATOCHEZIA 09/08/2008  . WEIGHT LOSS, ABNORMAL 09/08/2008  . NAUSEA, CHRONIC 09/08/2008  . CHANGE IN BOWELS 09/08/2008  . ABDOMINAL PAIN OTHER SPECIFIED SITE 09/08/2008  . THYROID NODULE, HX OF 09/08/2008    Teena Irani, PTA/CLT [phone] 03/04/2014, 10:58 AM  [place], [place], [address] Phone: [phone]   Fax:  [phone]

## 2014-03-12 ENCOUNTER — Ambulatory Visit (HOSPITAL_COMMUNITY)
Admission: RE | Admit: 2014-03-12 | Discharge: 2014-03-12 | Disposition: A | Discharge status: Home / Self Care | Payer: Medicare Other | Source: Ambulatory Visit | Attending: Internal Medicine | Admitting: Internal Medicine

## 2014-03-12 DIAGNOSIS — L8993 Pressure ulcer of unspecified site, stage 3: Secondary | ICD-10-CM

## 2014-03-12 DIAGNOSIS — E1129 Type 2 diabetes mellitus with other diabetic kidney complication: Secondary | ICD-10-CM | POA: Diagnosis not present

## 2014-03-12 DIAGNOSIS — Z6828 Body mass index (BMI) 28.0-28.9, adult: Secondary | ICD-10-CM | POA: Diagnosis not present

## 2014-03-12 DIAGNOSIS — E663 Overweight: Secondary | ICD-10-CM | POA: Diagnosis not present

## 2014-03-12 DIAGNOSIS — L0232 Furuncle of buttock: Secondary | ICD-10-CM | POA: Diagnosis not present

## 2014-03-12 NOTE — Therapy (Signed)
[place], [place], [address] Phone: [phone]   Fax:  [phone]  Wound Care Therapy  Patient Details  Name: Travis Phillips MRN: [id number] Date of Birth: Oct 08, 1955 Referring Provider:  Redmond School, MD  Encounter Date: 03/12/2014      PT End of Session - 03/12/14 1319    Visit Number 13   Number of Visits 20   Date for PT Re-Evaluation 04/22/14   Authorization Type [employer] Medicare   Authorization - Visit Number 13   Authorization - Number of Visits 18   PT Start Time 9758   PT Stop Time 1050   PT Time Calculation (min) 32 min   Activity Tolerance Patient tolerated treatment well   Behavior During Therapy [facility] for tasks assessed/performed      Past Medical History  Diagnosis Date  . Hypertension   . Dizziness   . Headache(784.0)   . Spasticity   . Hypercholesteremia   . Acid reflux   . Superficial siderosis of central nervous system 2012    Past Surgical History  Procedure Laterality Date  . Spinal tumor     . Hernia repair      There were no vitals taken for this visit.  Visit Diagnosis:  Pressure ulcer stage III                 Wound Therapy - 03/12/14 1315    Subjective Spouse states concern over another area that has presented on Lt ischial area.  Patient reports tenderness in this area.   Pain Assessment No/denies pain   Pressure Ulcer Properties Date First Assessed: 12/31/13 Time First Assessed: 0845 Location: Sacrum Staging: Stage III -  Full thickness tissue loss. Subcutaneous fat may be visible but bone, tendon or muscle are NOT exposed.   Dressing Type Impregnated gauze (bismuth)   Dressing Changed   Dressing Change Frequency PRN   State of Healing Early/partial granulation   Site / Wound Assessment Yellow;Red   % Wound base Red or Granulating 60%   % Wound base Yellow 40%   Peri-wound Assessment Intact;Erythema (blanchable)   Drainage Amount Minimal   Drainage  Description Serous   Treatment Cleansed;Debridement (Selective)   Wound Properties Date First Assessed: 02/11/14 Time First Assessed: 1110 Wound Type: Other (Comment) Location: Buttocks Location Orientation: Right Wound Description (Comments): superior to initial wound Present on Admission: No   Dressing Type Impregnated gauze (bismuth)   Dressing Changed Changed   Dressing Status Clean;Dry;Intact   Site / Wound Assessment Red   % Wound base Red or Granulating 60%   % Wound base Yellow 40%   Peri-wound Assessment Other (Comment)  callous   Margins Attached edges (approximated)   Drainage Amount Minimal   Drainage Description Serous   Treatment Cleansed;Debridement (Selective)   Selective Debridement - Location wound bed   Selective Debridement - Tools Used Scalpel   Selective Debridement - Tissue Removed adherent slough   Wound Therapy - Clinical Statement slough more adherent today with calloused borders most inferior wound which makes approximation difficult.  Able to debride slough and callous to thin.  Recommended patient return to primary MD to evaluate other area.  Area looks infected, possible boil.  No drainage or opened areas present.     Factors Delaying/Impairing Wound Healing Diabetes Mellitus;Immobility;Multiple medical problems;Polypharmacy   Hydrotherapy Plan Debridement;Dressing change;Patient/family education   Wound Therapy - Frequency Other (comment)  1 X week   Wound Plan continue with debridement  1X week.   Dressing  xeroform, ABD, medipore   Decrease Necrotic Tissue to 0   Decrease Necrotic Tissue - Progress Progressing toward goal   Increase Granulation Tissue to 100   Increase Granulation Tissue - Progress Progressing toward goal   Decrease Length/Width/Depth by (cm) 1.0 cm   Decrease Length/Width/Depth - Progress Progressing toward goal   Improve Drainage Characteristics Other (comment)   Improve Drainage Characteristics - Progress Progressing toward goal    Patient/Family will be able to  self dress until totally healed   Patient/Family Instruction Goal - Progress Met   Additional Wound Therapy Goal Pt to be able to sit with comfort   Additional Wound Therapy Goal - Progress Met   Goals/treatment plan/discharge plan were made with and agreed upon by patient/family Yes                   PT Short Term Goals - 01/08/14 1541    PT SHORT TERM GOAL #1   Title decrease necrotic tissue to 0   Time 2   Status On-going   PT SHORT TERM GOAL #2   Title Granulation tissue to be increased to 100%   Time 2   Period Weeks   Status On-going           PT Long Term Goals - 01/08/14 1541    PT LONG TERM GOAL #1   Title Wound size decreased by 1.0 cm diameter   Time 4   Status On-going   PT LONG TERM GOAL #2   Title Pt famility to verbalize importance of cleansing and changing dressing on a regular basis    Time 4   Status Achieved   PT LONG TERM GOAL #3   Title no pain with sitting   Time 4   Status On-going                Problem List Patient Active Problem List   Diagnosis Date Noted  . HYPERLIPIDEMIA-MIXED 11/28/2008  . HYPERTENSION, UNSPECIFIED 11/28/2008  . CAD, NATIVE VESSEL 11/28/2008  . MURMUR 11/28/2008  . CONSTIPATION 11/26/2008  . HEMATOCHEZIA 09/08/2008  . WEIGHT LOSS, ABNORMAL 09/08/2008  . NAUSEA, CHRONIC 09/08/2008  . CHANGE IN BOWELS 09/08/2008  . ABDOMINAL PAIN OTHER SPECIFIED SITE 09/08/2008  . THYROID NODULE, HX OF 09/08/2008    Teena Irani, PTA/CLT [phone] 03/12/2014, 1:21 PM  [place] [address] [place], [place], [address] Phone: [phone]   Fax:  [phone]

## 2014-03-18 ENCOUNTER — Ambulatory Visit (HOSPITAL_COMMUNITY)
Admission: RE | Admit: 2014-03-18 | Discharge: 2014-03-18 | Disposition: A | Discharge status: Home / Self Care | Payer: Medicare Other | Source: Ambulatory Visit | Attending: Internal Medicine | Admitting: Internal Medicine

## 2014-03-18 DIAGNOSIS — L8993 Pressure ulcer of unspecified site, stage 3: Secondary | ICD-10-CM

## 2014-03-18 NOTE — Therapy (Signed)
[place], [place], [address] Phone: [phone]   Fax:  [phone]  Wound Care Therapy  Patient Details  Name: Travis Phillips MRN: [id number] Date of Birth: [birth year] Referring Provider:  Redmond School, MD  Encounter Date: 03/18/2014      PT End of Session - 03/18/14 1148    Visit Number 14   Number of Visits 20   Date for PT Re-Evaluation 04/22/14   Authorization Type [employer] Medicare   Authorization - Visit Number 14   Authorization - Number of Visits 18   PT Start Time 1020   PT Stop Time 1050   PT Time Calculation (min) 30 min   Activity Tolerance Patient tolerated treatment well   Behavior During Therapy [facility] for tasks assessed/performed      Past Medical History  Diagnosis Date  . Hypertension   . Dizziness   . Headache(784.0)   . Spasticity   . Hypercholesteremia   . Acid reflux   . Superficial siderosis of central nervous system 2012    Past Surgical History  Procedure Laterality Date  . Spinal tumor     . Hernia repair      There were no vitals taken for this visit.  Visit Diagnosis:  Pressure ulcer stage III         Wound Therapy - 03/18/14 1111    Subjective spouse reports they went to MD regarding new area on Lt ischial region.  Pt with another pressure ulcer to be treated   Pain Assessment No/denies pain   Pressure Ulcer Properties Date First Assessed: 03/18/14 Time First Assessed: 1040 Location: Ischial tuberosity Location Orientation: Left Staging: Stage II -  Partial thickness loss of dermis presenting as a shallow open ulcer with a red, pink wound bed without slough. Present on Admission: No   Dressing Type Impregnated gauze (bismuth)   Dressing Intact   Dressing Change Frequency PRN   State of Healing Eschar   Site / Wound Assessment Red;Yellow   % Wound base Red or Granulating 0%   % Wound base Yellow 100%   Peri-wound Assessment Erythema (blanchable)   Wound Length  (cm) 1.5 cm   Wound Width (cm) 1 cm   Wound Depth (cm) 0 cm   Margins Unattached edges (unapproximated)   Drainage Amount Scant   Drainage Description Serosanguineous   Treatment Cleansed   Pressure Ulcer Properties Date First Assessed: 12/31/13 Time First Assessed: 0845 Location: Sacrum Staging: Stage III -  Full thickness tissue loss. Subcutaneous fat may be visible but bone, tendon or muscle are NOT exposed.   Dressing Type Impregnated gauze (bismuth)   Dressing Changed   Dressing Change Frequency PRN   State of Healing Early/partial granulation   Site / Wound Assessment Yellow;Red   % Wound base Red or Granulating 70%   % Wound base Yellow 30%   Peri-wound Assessment Intact;Erythema (blanchable)   Drainage Amount Minimal   Drainage Description Serous   Treatment Cleansed;Debridement (Selective)   Wound Properties Date First Assessed: 02/11/14 Time First Assessed: 1110 Wound Type: Other (Comment) Location: Buttocks Location Orientation: Right Wound Description (Comments): superior to initial wound Present on Admission: No   Dressing Type Impregnated gauze (bismuth)   Dressing Changed Changed   Dressing Status Clean;Dry;Intact   Site / Wound Assessment Red   % Wound base Red or Granulating 75%   % Wound base Yellow 25%   Peri-wound Assessment Other (Comment)  callous   Margins Attached  edges (approximated)   Drainage Amount Minimal   Drainage Description Serous   Treatment Cleansed;Debridement (Selective)   Selective Debridement - Location wound bed   Selective Debridement - Tools Used Scalpel   Selective Debridement - Tissue Removed adherent slough and callous   Wound Therapy - Clinical Statement Overall improvment noted today in initial 2 wounds with increased granulation and less slough to debride.  Debrided callous away from most inferior wound to promote approximation.  Began treatment on Lt ischial wound with noted adherent slough in wound bed.  Less redness as previous  visit perimeter of wound. Educated patient/CG again on importance of proper positioning to reduced pressure and increasing activity to decrease further pressure ulcers.  CG reported they use a donut shaped cushion to sit on at home and will begin using it more frequently as well as increasing activity.   Factors Delaying/Impairing Wound Healing Diabetes Mellitus;Immobility;Multiple medical problems;Polypharmacy   Hydrotherapy Plan Debridement;Dressing change;Patient/family education   Wound Therapy - Frequency Other (comment)  1 X week   Wound Plan continue with debridement 1X week.   Dressing  xeroform, ABD, medipore   Decrease Necrotic Tissue to 0   Decrease Necrotic Tissue - Progress Progressing toward goal   Increase Granulation Tissue to 100   Increase Granulation Tissue - Progress Progressing toward goal   Decrease Length/Width/Depth by (cm) 1.0 cm   Decrease Length/Width/Depth - Progress Progressing toward goal   Improve Drainage Characteristics Other (comment)   Improve Drainage Characteristics - Progress Progressing toward goal   Patient/Family will be able to  self dress until totally healed   Patient/Family Instruction Goal - Progress Met   Additional Wound Therapy Goal Pt to be able to sit with comfort   Additional Wound Therapy Goal - Progress Met   Goals/treatment plan/discharge plan were made with and agreed upon by patient/family Yes            PT Short Term Goals - 01/08/14 1541    PT SHORT TERM GOAL #1   Title decrease necrotic tissue to 0   Time 2   Status On-going   PT SHORT TERM GOAL #2   Title Granulation tissue to be increased to 100%   Time 2   Period Weeks   Status On-going           PT Long Term Goals - 01/08/14 1541    PT LONG TERM GOAL #1   Title Wound size decreased by 1.0 cm diameter   Time 4   Status On-going   PT LONG TERM GOAL #2   Title Pt famility to verbalize importance of cleansing and changing dressing on a regular basis    Time  4   Status Achieved   PT LONG TERM GOAL #3   Title no pain with sitting   Time 4   Status On-going            Plan - 03/18/14 1148    PT Frequency 1x / week   PT Duration 4 weeks   PT Treatment/Interventions Patient/family education;Other (comment)   PT Next Visit Plan debridement dressing with silver dressing    Consulted and Agree with Plan of Care Family member/caregiver;Patient   Family Member Consulted girlfriend         Problem List Patient Active Problem List   Diagnosis Date Noted  . HYPERLIPIDEMIA-MIXED 11/28/2008  . HYPERTENSION, UNSPECIFIED 11/28/2008  . CAD, NATIVE VESSEL 11/28/2008  . MURMUR 11/28/2008  . CONSTIPATION 11/26/2008  . HEMATOCHEZIA 09/08/2008  .  WEIGHT LOSS, ABNORMAL 09/08/2008  . NAUSEA, CHRONIC 09/08/2008  . CHANGE IN BOWELS 09/08/2008  . ABDOMINAL PAIN OTHER SPECIFIED SITE 09/08/2008  . THYROID NODULE, HX OF 09/08/2008    Teena Irani, PTA/CLT [phone] 03/18/2014, 11:49 AM  [place], [place], [address] Phone: [phone]   Fax:  [phone]

## 2014-03-25 ENCOUNTER — Ambulatory Visit (HOSPITAL_COMMUNITY)
Admission: RE | Admit: 2014-03-25 | Discharge: 2014-03-25 | Disposition: A | Discharge status: Home / Self Care | Payer: Commercial Managed Care - HMO | Source: Ambulatory Visit | Attending: Internal Medicine | Admitting: Internal Medicine

## 2014-03-25 DIAGNOSIS — Z5189 Encounter for other specified aftercare: Secondary | ICD-10-CM | POA: Insufficient documentation

## 2014-03-25 DIAGNOSIS — L8993 Pressure ulcer of unspecified site, stage 3: Secondary | ICD-10-CM

## 2014-03-25 DIAGNOSIS — L89153 Pressure ulcer of sacral region, stage 3: Secondary | ICD-10-CM | POA: Diagnosis not present

## 2014-03-25 DIAGNOSIS — E119 Type 2 diabetes mellitus without complications: Secondary | ICD-10-CM | POA: Insufficient documentation

## 2014-03-25 NOTE — Therapy (Signed)
[place], [place], [address] Phone: [phone]   Fax:  [phone]  Wound Care Therapy  Patient Details  Name: Travis Phillips MRN: [id number] Date of Birth: 12-06-1955 Referring Provider:  Redmond School, MD  Encounter Date: 03/25/2014      PT End of Session - 03/25/14 1357    Visit Number 15   Number of Visits 20   Date for PT Re-Evaluation 04/22/14   Authorization Type [employer] Medicare   Authorization - Visit Number 15   Authorization - Number of Visits 18   PT Start Time 0814   PT Stop Time 1056   PT Time Calculation (min) 38 min   Activity Tolerance Patient tolerated treatment well   Behavior During Therapy [facility] for tasks assessed/performed      Past Medical History  Diagnosis Date  . Hypertension   . Dizziness   . Headache(784.0)   . Spasticity   . Hypercholesteremia   . Acid reflux   . Superficial siderosis of central nervous system 2012    Past Surgical History  Procedure Laterality Date  . Spinal tumor     . Hernia repair      There were no vitals taken for this visit.  Visit Diagnosis:  Pressure ulcer stage III          Wound Therapy - 03/25/14 1353    Subjective Spouse states she thinks it's looking much better.  States she has been keeping him off if more and encouraging him to walk.    Pain Assessment No/denies pain   Pressure Ulcer Properties Date First Assessed: 03/18/14 Time First Assessed: 4818 Location: Ischial tuberosity Location Orientation: Left Staging: Stage II -  Partial thickness loss of dermis presenting as a shallow open ulcer with a red, pink wound bed without slough. Present on Admission: No   Dressing Type Impregnated gauze (bismuth)   Dressing Intact   Dressing Change Frequency PRN   State of Healing Early/partial granulation   Site / Wound Assessment Red;Yellow   % Wound base Red or Granulating 20%   % Wound base Yellow 80%   Peri-wound Assessment Erythema  (blanchable)   Margins Attached edges (approximated)   Drainage Amount Scant   Drainage Description Serosanguineous   Treatment Cleansed;Debridement (Selective)   Pressure Ulcer Properties Date First Assessed: 12/31/13 Time First Assessed: 0845 Location: Sacrum Staging: Stage III -  Full thickness tissue loss. Subcutaneous fat may be visible but bone, tendon or muscle are NOT exposed.   Dressing Type Impregnated gauze (bismuth)   Dressing Changed   Dressing Change Frequency PRN   State of Healing Early/partial granulation   Site / Wound Assessment Yellow;Red   % Wound base Red or Granulating 75%   % Wound base Yellow 25%   Peri-wound Assessment Intact;Erythema (blanchable)   Drainage Amount Minimal   Drainage Description Serous   Treatment Cleansed;Debridement (Selective)   Wound Properties Date First Assessed: 02/11/14 Time First Assessed: 1110 Wound Type: Other (Comment) Location: Buttocks Location Orientation: Right Wound Description (Comments): superior to initial wound Present on Admission: No   Dressing Type Impregnated gauze (bismuth)   Dressing Changed Changed   Dressing Status Clean;Dry;Intact   Site / Wound Assessment Red   % Wound base Red or Granulating 75%   % Wound base Yellow 25%   Peri-wound Assessment Other (Comment)  callous   Margins Attached edges (approximated)   Drainage Amount Minimal   Drainage Description Serous   Treatment Cleansed;Debridement (  Selective)   Selective Debridement - Location All wounds with increased granulation today and visible reduction in size today.  Inferior wound on Rt buttock still with calloused edges, however able to remove more to encourage approximation.  Continued dressing with xeroform.   Selective Debridement - Tools Used Scalpel   Selective Debridement - Tissue Removed adherent slough and callous   Wound Therapy - Clinical Statement Overall improvment noted today in initial 2 wounds with increased granulation and less slough to  debride.  Debrided callous away from most inferior wound   Factors Delaying/Impairing Wound Healing Diabetes Mellitus;Immobility;Multiple medical problems;Polypharmacy   Hydrotherapy Plan Debridement;Dressing change;Patient/family education   Wound Therapy - Frequency Other (comment)  1 X week   Wound Plan continue with debridement 1X week.   Dressing  xeroform, ABD, medipore   Decrease Necrotic Tissue to 0   Decrease Necrotic Tissue - Progress Progressing toward goal   Increase Granulation Tissue to 100   Increase Granulation Tissue - Progress Progressing toward goal   Decrease Length/Width/Depth by (cm) 1.0 cm   Decrease Length/Width/Depth - Progress Progressing toward goal   Improve Drainage Characteristics Other (comment)   Improve Drainage Characteristics - Progress Progressing toward goal   Patient/Family will be able to  self dress until totally healed   Patient/Family Instruction Goal - Progress Met   Additional Wound Therapy Goal Pt to be able to sit with comfort   Additional Wound Therapy Goal - Progress Met   Goals/treatment plan/discharge plan were made with and agreed upon by patient/family Yes             PT Short Term Goals - 01/08/14 1541    PT SHORT TERM GOAL #1   Title decrease necrotic tissue to 0   Time 2   Status On-going   PT SHORT TERM GOAL #2   Title Granulation tissue to be increased to 100%   Time 2   Period Weeks   Status On-going           PT Long Term Goals - 01/08/14 1541    PT LONG TERM GOAL #1   Title Wound size decreased by 1.0 cm diameter   Time 4   Status On-going   PT LONG TERM GOAL #2   Title Pt famility to verbalize importance of cleansing and changing dressing on a regular basis    Time 4   Status Achieved   PT LONG TERM GOAL #3   Title no pain with sitting   Time 4   Status On-going        Problem List Patient Active Problem List   Diagnosis Date Noted  . HYPERLIPIDEMIA-MIXED 11/28/2008  . HYPERTENSION,  UNSPECIFIED 11/28/2008  . CAD, NATIVE VESSEL 11/28/2008  . MURMUR 11/28/2008  . CONSTIPATION 11/26/2008  . HEMATOCHEZIA 09/08/2008  . WEIGHT LOSS, ABNORMAL 09/08/2008  . NAUSEA, CHRONIC 09/08/2008  . CHANGE IN BOWELS 09/08/2008  . ABDOMINAL PAIN OTHER SPECIFIED SITE 09/08/2008  . THYROID NODULE, HX OF 09/08/2008    Teena Irani, PTA/CLT [phone] 03/25/2014, 1:58 PM  [place] [address] [place], [place], [address] Phone: [phone]   Fax:  [phone]

## 2014-04-01 ENCOUNTER — Ambulatory Visit (HOSPITAL_COMMUNITY)
Admission: RE | Admit: 2014-04-01 | Discharge: 2014-04-01 | Disposition: A | Discharge status: Home / Self Care | Payer: Commercial Managed Care - HMO | Source: Ambulatory Visit | Attending: Internal Medicine | Admitting: Internal Medicine

## 2014-04-01 DIAGNOSIS — Z5189 Encounter for other specified aftercare: Secondary | ICD-10-CM | POA: Diagnosis not present

## 2014-04-01 DIAGNOSIS — E119 Type 2 diabetes mellitus without complications: Secondary | ICD-10-CM | POA: Diagnosis not present

## 2014-04-01 DIAGNOSIS — L8993 Pressure ulcer of unspecified site, stage 3: Secondary | ICD-10-CM

## 2014-04-01 DIAGNOSIS — L89153 Pressure ulcer of sacral region, stage 3: Secondary | ICD-10-CM | POA: Diagnosis not present

## 2014-04-01 NOTE — Therapy (Signed)
[place], [place], [address] Phone: [phone]   Fax:  [phone]  Wound Care Therapy  Patient Details  Name: Travis Phillips MRN: [id number] Date of Birth: January 11, 1956 Referring Provider:  Redmond School, MD  Encounter Date: 04/01/2014      PT End of Session - 04/01/14 1209    Visit Number 16   Number of Visits 20   Date for PT Re-Evaluation 04/22/14   Authorization Type [employer] Medicare   Authorization - Visit Number 16   Authorization - Number of Visits 18   PT Start Time 1025   PT Stop Time 1050   PT Time Calculation (min) 25 min   Activity Tolerance Patient tolerated treatment well   Behavior During Therapy [facility] for tasks assessed/performed      Past Medical History  Diagnosis Date  . Hypertension   . Dizziness   . Headache(784.0)   . Spasticity   . Hypercholesteremia   . Acid reflux   . Superficial siderosis of central nervous system 2012    Past Surgical History  Procedure Laterality Date  . Spinal tumor     . Hernia repair      There were no vitals taken for this visit.  Visit Diagnosis:  Pressure ulcer stage III          Wound Therapy - 04/01/14 1207    Subjective Spouse states she thinks it's looking much better.  States she has been keeping him off if more and encouraging him to walk.    Pain Assessment No/denies pain   Pressure Ulcer Properties Date First Assessed: 03/18/14 Time First Assessed: 7619 Location: Ischial tuberosity Location Orientation: Left Staging: Stage II -  Partial thickness loss of dermis presenting as a shallow open ulcer with a red, pink wound bed without slough. Present on Admission: No   Dressing Type Impregnated gauze (bismuth)   Dressing Intact   Dressing Change Frequency PRN   State of Healing Early/partial granulation   Site / Wound Assessment Red;Yellow   % Wound base Red or Granulating 40%   % Wound base Yellow 60%   Peri-wound Assessment Erythema  (blanchable)   Margins Attached edges (approximated)   Drainage Amount Scant   Drainage Description Serosanguineous   Treatment Cleansed;Debridement (Selective)   Pressure Ulcer Properties Date First Assessed: 12/31/13 Time First Assessed: 0845 Location: Sacrum Staging: Stage III -  Full thickness tissue loss. Subcutaneous fat may be visible but bone, tendon or muscle are NOT exposed.   Dressing Type Impregnated gauze (bismuth)   Dressing Changed   Dressing Change Frequency PRN   State of Healing Early/partial granulation   Site / Wound Assessment Yellow;Red   % Wound base Red or Granulating 80%   % Wound base Yellow 20%   Peri-wound Assessment Intact;Erythema (blanchable)   Drainage Amount Minimal   Drainage Description Serous   Treatment Cleansed;Debridement (Selective)   Wound Properties Date First Assessed: 02/11/14 Time First Assessed: 1110 Wound Type: Other (Comment) Location: Buttocks Location Orientation: Right Wound Description (Comments): superior to initial wound Present on Admission: No   Dressing Type Impregnated gauze (bismuth)   Dressing Changed Changed   Dressing Status Clean;Dry;Intact   Site / Wound Assessment Red   % Wound base Red or Granulating 80%   % Wound base Yellow 20%   Peri-wound Assessment Other (Comment)  callous   Margins Attached edges (approximated)   Drainage Amount Minimal   Drainage Description Serous   Treatment Cleansed;Debridement (  Selective)   Selective Debridement - Location All wounds with increased granulation today.  Reduced callous around edges of inferior Rt buttock wound with improved approximation. Continued dressing with xeroform.   Selective Debridement - Tools Used Scalpel   Selective Debridement - Tissue Removed adherent slough and callous   Wound Therapy - Clinical Statement Overall improvment noted today in initial 2 wounds with increased granulation and less slough to debride.  Debrided callous away from most inferior wound    Factors Delaying/Impairing Wound Healing Diabetes Mellitus;Immobility;Multiple medical problems;Polypharmacy   Hydrotherapy Plan Measure wounds next visit.     Wound Therapy - Frequency Other (comment)  1 X week   Wound Plan continue with debridement 1X week.   Dressing  xeroform, ABD, medipore   Decrease Necrotic Tissue to 0   Decrease Necrotic Tissue - Progress Progressing toward goal   Increase Granulation Tissue to 100   Increase Granulation Tissue - Progress Progressing toward goal   Decrease Length/Width/Depth by (cm) 1.0 cm   Decrease Length/Width/Depth - Progress Progressing toward goal   Improve Drainage Characteristics Other (comment)   Improve Drainage Characteristics - Progress Progressing toward goal   Patient/Family will be able to  self dress until totally healed   Patient/Family Instruction Goal - Progress Met   Additional Wound Therapy Goal Pt to be able to sit with comfort   Additional Wound Therapy Goal - Progress Met   Goals/treatment plan/discharge plan were made with and agreed upon by patient/family Yes                   PT Short Term Goals - 01/08/14 1541    PT SHORT TERM GOAL #1   Title decrease necrotic tissue to 0   Time 2   Status On-going   PT SHORT TERM GOAL #2   Title Granulation tissue to be increased to 100%   Time 2   Period Weeks   Status On-going           PT Long Term Goals - 01/08/14 1541    PT LONG TERM GOAL #1   Title Wound size decreased by 1.0 cm diameter   Time 4   Status On-going   PT LONG TERM GOAL #2   Title Pt famility to verbalize importance of cleansing and changing dressing on a regular basis    Time 4   Status Achieved   PT LONG TERM GOAL #3   Title no pain with sitting   Time 4   Status On-going          Problem List Patient Active Problem List   Diagnosis Date Noted  . HYPERLIPIDEMIA-MIXED 11/28/2008  . HYPERTENSION, UNSPECIFIED 11/28/2008  . CAD, NATIVE VESSEL 11/28/2008  . MURMUR  11/28/2008  . CONSTIPATION 11/26/2008  . HEMATOCHEZIA 09/08/2008  . WEIGHT LOSS, ABNORMAL 09/08/2008  . NAUSEA, CHRONIC 09/08/2008  . CHANGE IN BOWELS 09/08/2008  . ABDOMINAL PAIN OTHER SPECIFIED SITE 09/08/2008  . THYROID NODULE, HX OF 09/08/2008    Teena Irani, PTA/CLT [phone] 04/01/2014, 12:13 PM  [place] [address] [place], [place], [address] Phone: [phone]   Fax:  [phone]

## 2014-04-03 DIAGNOSIS — E782 Mixed hyperlipidemia: Secondary | ICD-10-CM | POA: Diagnosis not present

## 2014-04-03 DIAGNOSIS — Z6828 Body mass index (BMI) 28.0-28.9, adult: Secondary | ICD-10-CM | POA: Diagnosis not present

## 2014-04-03 DIAGNOSIS — L219 Seborrheic dermatitis, unspecified: Secondary | ICD-10-CM | POA: Diagnosis not present

## 2014-04-03 DIAGNOSIS — I10 Essential (primary) hypertension: Secondary | ICD-10-CM | POA: Diagnosis not present

## 2014-04-09 ENCOUNTER — Ambulatory Visit (HOSPITAL_COMMUNITY): Payer: Commercial Managed Care - HMO | Admitting: Physical Therapy

## 2014-04-09 DIAGNOSIS — Z5189 Encounter for other specified aftercare: Secondary | ICD-10-CM | POA: Diagnosis not present

## 2014-04-09 DIAGNOSIS — L89153 Pressure ulcer of sacral region, stage 3: Secondary | ICD-10-CM | POA: Diagnosis not present

## 2014-04-09 DIAGNOSIS — L8993 Pressure ulcer of unspecified site, stage 3: Secondary | ICD-10-CM

## 2014-04-09 DIAGNOSIS — E119 Type 2 diabetes mellitus without complications: Secondary | ICD-10-CM | POA: Diagnosis not present

## 2014-04-09 NOTE — Therapy (Signed)
[place], [place], [address] Phone: [phone]   Fax:  [phone]  Wound Care Therapy  Patient Details  Name: Travis Phillips MRN: [id number] Date of Birth: October 09, 1955 Referring Provider:  Redmond School, MD  Encounter Date: 04/09/2014      PT End of Session - 04/09/14 1431    Visit Number 17   Number of Visits 20   Date for PT Re-Evaluation 04/22/14   Authorization Type [employer] Medicare   Authorization - Visit Number 17   Authorization - Number of Visits 18   PT Start Time 1350   PT Stop Time 1420   PT Time Calculation (min) 30 min   Activity Tolerance Patient tolerated treatment well   Behavior During Therapy [facility] for tasks assessed/performed      Past Medical History  Diagnosis Date  . Hypertension   . Dizziness   . Headache(784.0)   . Spasticity   . Hypercholesteremia   . Acid reflux   . Superficial siderosis of central nervous system 2012    Past Surgical History  Procedure Laterality Date  . Spinal tumor     . Hernia repair      There were no vitals taken for this visit.  Visit Diagnosis:  Pressure ulcer stage III                 Wound Therapy - 04/09/14 1416    Subjective Spouse reports patient has not been feeling well today with intestinal issues.  States she thinks his wounds are doing much better.     Pain Assessment No/denies pain   Pressure Ulcer Properties Date First Assessed: 12/31/13 Time First Assessed: 0845 Location: Sacrum Staging: Stage III -  Full thickness tissue loss. Subcutaneous fat may be visible but bone, tendon or muscle are NOT exposed. Wound Description (Comments): Rt buttocks, largest.  initial wound   Dressing Type Impregnated gauze (bismuth)   Dressing Changed   Dressing Change Frequency PRN   State of Healing Early/partial granulation   Site / Wound Assessment Yellow;Red   % Wound base Red or Granulating 80%   % Wound base Yellow 20%   Peri-wound  Assessment Intact;Erythema (blanchable)   Wound Length (cm) 1.5 cm  was 1 cm on 1/13   Wound Width (cm) 1.5 cm  was 1.2 cm on 1/13   Wound Depth (cm) 0.1 cm  was 0.1 cm on 1/13   Drainage Amount Minimal   Drainage Description Serous   Treatment Cleansed;Debridement (Selective)   Pressure Ulcer Properties Date First Assessed: 03/18/14 Time First Assessed: 1040 Location: Ischial tuberosity Location Orientation: Left Staging: Stage II -  Partial thickness loss of dermis presenting as a shallow open ulcer with a red, pink wound bed without slough. Wound Description (Comments): Lt ischial tuberosity Present on Admission: No   Dressing Type Impregnated gauze (bismuth)   Dressing Intact   Dressing Change Frequency PRN   State of Healing Early/partial granulation   Site / Wound Assessment Red;Yellow   % Wound base Red or Granulating 60%   % Wound base Yellow 40%   Peri-wound Assessment Erythema (blanchable)   Wound Length (cm) 0.8 cm  was 1.5cm on 1/27   Wound Width (cm) 0.5 cm  was 1 cm on 1/27   Wound Depth (cm) 0 cm   Margins Attached edges (approximated)   Drainage Amount Scant   Drainage Description Serosanguineous   Treatment Cleansed;Debridement (Selective)   Wound Properties Date First Assessed:  02/11/14 Time First Assessed: 1110 Wound Type: Other (Comment) Location: Buttocks Location Orientation: Right Wound Description (Comments): superior to initial wound Present on Admission: No   Dressing Type Impregnated gauze (bismuth)   Dressing Changed Changed   Dressing Status Clean;Dry;Intact   Site / Wound Assessment Red   % Wound base Red or Granulating 90%   % Wound base Yellow 10%   Peri-wound Assessment Other (Comment)  callous   Wound Length (cm) 0.8 cm  was 1.5 on 1/27   Wound Width (cm) 0.5 cm  was 1 cm on 1/27   Wound Depth (cm) 0 cm   Margins Attached edges (approximated)   Drainage Amount Minimal   Drainage Description Serous   Treatment Cleansed;Debridement  (Selective)   Selective Debridement - Location Rt buttock and Lt ischial tuberosity wounds   Selective Debridement - Tools Used Scalpel   Selective Debridement - Tissue Removed adherent slough and callous   Wound Therapy - Clinical Statement Wounds remeasured today with overall reduction of most superior Rt buttock wound and Lt ischial tuberosity wound.  Most inferior wound on Rt buttock has increased in size, however callous has been debrided from perimeter to promote approximation.  All wounds have increased in granulation.  Wounds appear to be responding well to xeroform dressing.     Factors Delaying/Impairing Wound Healing Diabetes Mellitus;Immobility;Multiple medical problems;Polypharmacy   Hydrotherapy Plan Debridement;Dressing change;Patient/family education   Wound Therapy - Frequency Other (comment)  1 X week   Wound Plan continue with debridement 1X week.   Dressing  xeroform, ABD, medipore   Decrease Necrotic Tissue to 0   Decrease Necrotic Tissue - Progress Progressing toward goal   Increase Granulation Tissue to 100   Increase Granulation Tissue - Progress Progressing toward goal   Decrease Length/Width/Depth by (cm) 1.0 cm   Decrease Length/Width/Depth - Progress Progressing toward goal   Improve Drainage Characteristics Other (comment)   Improve Drainage Characteristics - Progress Progressing toward goal   Patient/Family will be able to  self dress until totally healed   Patient/Family Instruction Goal - Progress Met   Additional Wound Therapy Goal Pt to be able to sit with comfort   Additional Wound Therapy Goal - Progress Met   Goals/treatment plan/discharge plan were made with and agreed upon by patient/family Yes               PT Short Term Goals - 01/08/14 1541    PT SHORT TERM GOAL #1   Title decrease necrotic tissue to 0   Time 2   Status On-going   PT SHORT TERM GOAL #2   Title Granulation tissue to be increased to 100%   Time 2   Period Weeks    Status On-going           PT Long Term Goals - 01/08/14 1541    PT LONG TERM GOAL #1   Title Wound size decreased by 1.0 cm diameter   Time 4   Status On-going   PT LONG TERM GOAL #2   Title Pt famility to verbalize importance of cleansing and changing dressing on a regular basis    Time 4   Status Achieved   PT LONG TERM GOAL #3   Title no pain with sitting   Time 4   Status On-going          Problem List Patient Active Problem List   Diagnosis Date Noted  . HYPERLIPIDEMIA-MIXED 11/28/2008  . HYPERTENSION, UNSPECIFIED 11/28/2008  . CAD, NATIVE  VESSEL 11/28/2008  . MURMUR 11/28/2008  . CONSTIPATION 11/26/2008  . HEMATOCHEZIA 09/08/2008  . WEIGHT LOSS, ABNORMAL 09/08/2008  . NAUSEA, CHRONIC 09/08/2008  . CHANGE IN BOWELS 09/08/2008  . ABDOMINAL PAIN OTHER SPECIFIED SITE 09/08/2008  . THYROID NODULE, HX OF 09/08/2008    Teena Irani, PTA/CLT [phone] 04/09/2014, 2:32 PM  [facility] [address] [place], [place], [address] Phone: [phone]   Fax:  [phone]

## 2014-04-14 ENCOUNTER — Ambulatory Visit (INDEPENDENT_AMBULATORY_CARE_PROVIDER_SITE_OTHER): Payer: Commercial Managed Care - HMO | Admitting: Urology

## 2014-04-14 ENCOUNTER — Other Ambulatory Visit: Payer: Self-pay | Admitting: Urology

## 2014-04-14 DIAGNOSIS — R350 Frequency of micturition: Secondary | ICD-10-CM | POA: Diagnosis not present

## 2014-04-14 DIAGNOSIS — R3915 Urgency of urination: Secondary | ICD-10-CM | POA: Diagnosis not present

## 2014-04-14 DIAGNOSIS — N200 Calculus of kidney: Secondary | ICD-10-CM

## 2014-04-14 DIAGNOSIS — R3914 Feeling of incomplete bladder emptying: Secondary | ICD-10-CM

## 2014-04-14 DIAGNOSIS — R310 Gross hematuria: Secondary | ICD-10-CM

## 2014-04-14 DIAGNOSIS — N401 Enlarged prostate with lower urinary tract symptoms: Secondary | ICD-10-CM | POA: Diagnosis not present

## 2014-04-15 ENCOUNTER — Ambulatory Visit (HOSPITAL_COMMUNITY): Payer: Commercial Managed Care - HMO | Admitting: Physical Therapy

## 2014-04-15 DIAGNOSIS — L89153 Pressure ulcer of sacral region, stage 3: Secondary | ICD-10-CM | POA: Diagnosis not present

## 2014-04-15 DIAGNOSIS — Z5189 Encounter for other specified aftercare: Secondary | ICD-10-CM | POA: Diagnosis not present

## 2014-04-15 DIAGNOSIS — L8993 Pressure ulcer of unspecified site, stage 3: Secondary | ICD-10-CM

## 2014-04-15 DIAGNOSIS — E119 Type 2 diabetes mellitus without complications: Secondary | ICD-10-CM | POA: Diagnosis not present

## 2014-04-15 NOTE — Therapy (Signed)
[place], [place], [address] Phone: [phone]   Fax:  [phone]  Wound Care Therapy  Patient Details  Name: Travis Phillips MRN: [id number] Date of Birth: [birth year] Referring Provider:  Redmond School, MD  Encounter Date: 04/15/2014      PT End of Session - 04/15/14 1107    Visit Number 18   Number of Visits 20   Date for PT Re-Evaluation 04/22/14   Authorization Type [employer] Medicare   Authorization - Visit Number 18   Authorization - Number of Visits 28   PT Start Time 1020   PT Stop Time 1045   PT Time Calculation (min) 25 min   Activity Tolerance Patient tolerated treatment well   Behavior During Therapy [facility] for tasks assessed/performed      Past Medical History  Diagnosis Date  . Hypertension   . Dizziness   . Headache(784.0)   . Spasticity   . Hypercholesteremia   . Acid reflux   . Superficial siderosis of central nervous system 2012    Past Surgical History  Procedure Laterality Date  . Spinal tumor     . Hernia repair      There were no vitals taken for this visit.  Visit Diagnosis:  Pressure ulcer stage III                 Wound Therapy - 04/15/14 1059    Subjective CG reports patient went to urologist yesterday.  States Xrays showed everything was good except his bladder is not emptying completely.  Pt without c/o pain or discomfort.   Pressure Ulcer Properties Date First Assessed: 12/31/13 Time First Assessed: 0845 Location: Sacrum Staging: Stage III -  Full thickness tissue loss. Subcutaneous fat may be visible but bone, tendon or muscle are NOT exposed. Wound Description (Comments): Rt buttocks, largest.  initial wound   Dressing Type Impregnated gauze (bismuth)   Dressing Changed   Dressing Change Frequency PRN   State of Healing Early/partial granulation   Site / Wound Assessment Yellow;Red   % Wound base Red or Granulating 90%   % Wound base Yellow 10%   Peri-wound Assessment Intact;Erythema (blanchable)   Wound Length (cm) 0.4 cm  3 smaller wounds now.  Was one large wound measuring 1.5 cm   Wound Width (cm) 0.4 cm  3 smaller wounds now.  Was one large wound measuring 1.5 cm   Wound Depth (cm) 0 cm   Drainage Amount Minimal   Drainage Description Serous   Treatment Cleansed;Debridement (Selective)   Pressure Ulcer Properties Date First Assessed: 03/18/14 Time First Assessed: 1040 Location: Ischial tuberosity Location Orientation: Left Staging: Stage II -  Partial thickness loss of dermis presenting as a shallow open ulcer with a red, pink wound bed without slough. Wound Description (Comments): Lt ischial tuberosity Present on Admission: No   Dressing Type Impregnated gauze (bismuth)   Dressing Intact   Dressing Change Frequency PRN   State of Healing Early/partial granulation   Site / Wound Assessment Red;Yellow   % Wound base Red or Granulating 90%   % Wound base Yellow 10%   Peri-wound Assessment Erythema (blanchable)   Wound Length (cm) 0.6 cm   Wound Width (cm) 0.5 cm   Wound Depth (cm) 0 cm   Margins Attached edges (approximated)   Drainage Amount Scant   Drainage Description Serosanguineous   Treatment Cleansed;Debridement (Selective)   Wound Properties Date First Assessed: 02/11/14 Time First Assessed: 1110 Wound  Type: Other (Comment) Location: Buttocks Location Orientation: Right Wound Description (Comments): superior to initial wound Present on Admission: No   Dressing Type Impregnated gauze (bismuth)   Dressing Changed Changed   Dressing Status Clean;Dry;Intact   Site / Wound Assessment Red   % Wound base Red or Granulating 100%   % Wound base Yellow 0%   Peri-wound Assessment Other (Comment)  callous   Wound Length (cm) 0.2 cm  was 0.8cm   Wound Width (cm) 0.2 cm  was 0.5cm   Wound Depth (cm) 0 cm   Margins Attached edges (approximated)   Drainage Amount Minimal   Drainage Description Serous   Treatment  Cleansed;Debridement (Selective)   Selective Debridement - Location Rt buttock and Lt ischial tuberosity wounds   Selective Debridement - Tools Used Scalpel   Selective Debridement - Tissue Removed adherent slough and callous   Wound Therapy - Clinical Statement Vast improvement in all wounds this week with reduction in size and increase in granulation.  Initial wound on Lt buttock is now approximated into 3 small areas. Callous is decreasing and beginning to close.  Most superior wound is almost closed and is 100% granulated.  Lt ischial tuberosity wound is also decreased in size and increased in granulation.     Factors Delaying/Impairing Wound Healing Diabetes Mellitus;Immobility;Multiple medical problems;Polypharmacy   Hydrotherapy Plan Debridement;Dressing change;Patient/family education   Wound Therapy - Frequency Other (comment)  1 X week   Wound Plan continue with debridement 1X week.   Dressing  xeroform,gauze, medipore   Decrease Necrotic Tissue to 0   Increase Granulation Tissue to 100   Decrease Length/Width/Depth by (cm) 1.0 cm   Improve Drainage Characteristics Other (comment)   Patient/Family will be able to  self dress until totally healed   Additional Wound Therapy Goal Pt to be able to sit with comfort   Goals/treatment plan/discharge plan were made with and agreed upon by patient/family Yes                   PT Short Term Goals - 05/01/2014 1108    PT SHORT TERM GOAL #1   Title decrease necrotic tissue to 0   Time 2   Period Weeks   Status Partially Met   PT SHORT TERM GOAL #2   Title Granulation tissue to be increased to 100%   Time 2   Period Weeks   Status Partially Met           PT Long Term Goals - 05/01/2014 1108    PT LONG TERM GOAL #1   Title Wound size decreased by 1.0 cm diameter   Time 4   Period Weeks   Status Partially Met   PT LONG TERM GOAL #2   Title Pt famility to verbalize importance of cleansing and changing dressing on a  regular basis    Time 4   Period Weeks   Status Achieved   PT LONG TERM GOAL #3   Title no pain with sitting   Time 4   Period Weeks   Status Achieved                 G-Codes - 05/01/2014 1241    Functional Limitation Other PT subsequent   Other PT Primary Current Status (T7017) At least 1 percent but less than 20 percent impaired, limited or restricted   Other PT Primary Goal Status (B9390) At least 1 percent but less than 20 percent impaired, limited or restricted  Problem List Patient Active Problem List   Diagnosis Date Noted  . HYPERLIPIDEMIA-MIXED 11/28/2008  . HYPERTENSION, UNSPECIFIED 11/28/2008  . CAD, NATIVE VESSEL 11/28/2008  . MURMUR 11/28/2008  . CONSTIPATION 11/26/2008  . HEMATOCHEZIA 09/08/2008  . WEIGHT LOSS, ABNORMAL 09/08/2008  . NAUSEA, CHRONIC 09/08/2008  . CHANGE IN BOWELS 09/08/2008  . ABDOMINAL PAIN OTHER SPECIFIED SITE 09/08/2008  . THYROID NODULE, HX OF 09/08/2008    Teena Irani, PTA/CLT [phone] 04/15/2014, 12:43 PM  [facility] [address] [place], [place], [address] Phone: [phone]   Fax:  [phone]

## 2014-04-22 ENCOUNTER — Ambulatory Visit (HOSPITAL_COMMUNITY): Payer: Commercial Managed Care - HMO | Attending: Internal Medicine | Admitting: Physical Therapy

## 2014-04-22 DIAGNOSIS — L89153 Pressure ulcer of sacral region, stage 3: Secondary | ICD-10-CM | POA: Insufficient documentation

## 2014-04-22 DIAGNOSIS — Z5189 Encounter for other specified aftercare: Secondary | ICD-10-CM | POA: Diagnosis not present

## 2014-04-22 DIAGNOSIS — E119 Type 2 diabetes mellitus without complications: Secondary | ICD-10-CM | POA: Diagnosis not present

## 2014-04-22 DIAGNOSIS — L8993 Pressure ulcer of unspecified site, stage 3: Secondary | ICD-10-CM

## 2014-04-22 NOTE — Therapy (Signed)
[place], [place], [address] Phone: [phone]   Fax:  [phone]  Wound Care Therapy  Patient Details  Name: Travis Phillips MRN: [id number] Date of Birth: November 01, 1955 Referring Provider:  Elsie Lincoln, MD  Encounter Date: 04/22/2014      PT End of Session - 04/22/14 1429    Visit Number 19   Number of Visits 20   Date for PT Re-Evaluation 04/29/14   Authorization Type [employer] Medicare   Authorization - Visit Number 19   Authorization - Number of Visits 28   PT Start Time 9211   PT Stop Time 1420   PT Time Calculation (min) 25 min   Activity Tolerance Patient tolerated treatment well   Behavior During Therapy [facility] for tasks assessed/performed      Past Medical History  Diagnosis Date  . Hypertension   . Dizziness   . Headache(784.0)   . Spasticity   . Hypercholesteremia   . Acid reflux   . Superficial siderosis of central nervous system 2012    Past Surgical History  Procedure Laterality Date  . Spinal tumor     . Hernia repair      There were no vitals taken for this visit.  Visit Diagnosis:  Pressure ulcer stage III       Wound Therapy - 04/22/14 1417    Subjective Pt states urinalisis showed blood in his urine and are unsure if he has kidney stones.  states he is to get an xray soon.  PT without c/o pain and CG reports the wounds are healing nicely.   Pain Assessment No/denies pain   Pressure Ulcer Properties Date First Assessed: 12/31/13 Time First Assessed: 0845 Location: Sacrum Staging: Stage III -  Full thickness tissue loss. Subcutaneous fat may be visible but bone, tendon or muscle are NOT exposed. Wound Description (Comments): Rt buttocks, largest.  initial wound   Dressing Type Impregnated gauze (bismuth)   Dressing Changed   Dressing Change Frequency PRN   State of Healing Early/partial granulation   Site / Wound Assessment Yellow;Red   % Wound base Red or Granulating 90%   %  Wound base Yellow 10%   Peri-wound Assessment Intact;Erythema (blanchable)   Wound Length (cm) 0.2 cm   Wound Width (cm) 0.4 cm   Wound Depth (cm) 0 cm   Drainage Amount Minimal   Drainage Description Serous   Treatment Cleansed;Debridement (Selective)   Pressure Ulcer Properties Date First Assessed: 03/18/14 Time First Assessed: 9417 Location: Ischial tuberosity Location Orientation: Left Staging: Stage II -  Partial thickness loss of dermis presenting as a shallow open ulcer with a red, pink wound bed without slough. Wound Description (Comments): Lt ischial tuberosity Present on Admission: No   Dressing Type Impregnated gauze (bismuth)   Dressing Intact   Dressing Change Frequency PRN   State of Healing Early/partial granulation   Site / Wound Assessment Red;Yellow   % Wound base Red or Granulating 90%   % Wound base Yellow 10%   Peri-wound Assessment Erythema (blanchable)   Wound Length (cm) 0.5 cm   Wound Width (cm) 0.4 cm   Wound Depth (cm) 0 cm   Margins Attached edges (approximated)   Drainage Amount Scant   Drainage Description Serosanguineous   Treatment Cleansed;Debridement (Selective)   Wound Properties Date First Assessed: 02/11/14 Time First Assessed: 1110 Wound Type: Other (Comment) Location: Buttocks Location Orientation: Right Wound Description (Comments): superior to initial wound Present on Admission: No  Final Assessment Date: 04/22/14 Final Assessment Time: 1423   Dressing Type --   Dressing Status --   Site / Wound Assessment --   % Wound base Red or Granulating --   % Wound base Yellow --   Peri-wound Assessment --   Margins --   Drainage Amount --   Drainage Description --   Selective Debridement - Location Rt buttock and Lt ischial tuberosity wounds   Selective Debridement - Tools Used Scalpel   Selective Debridement - Tissue Removed adherent slough and callous   Wound Therapy - Clinical Statement Continued improvment in all wounds with most superior Rt  buttock wound healed.  Remaining 2 wounds are 100% granulated without signs   Factors Delaying/Impairing Wound Healing Diabetes Mellitus;Immobility;Multiple medical problems;Polypharmacy   Hydrotherapy Plan Debridement;Dressing change;Patient/family education   Wound Therapy - Frequency Other (comment)  1 X week   Wound Plan Re-evaluate next visit with anticipation of discharge.    Dressing  xeroform,gauze, medipore   Decrease Necrotic Tissue to 0   Increase Granulation Tissue to 100   Decrease Length/Width/Depth by (cm) 1.0 cm   Improve Drainage Characteristics Other (comment)   Patient/Family will be able to  self dress until totally healed   Additional Wound Therapy Goal Pt to be able to sit with comfort   Goals/treatment plan/discharge plan were made with and agreed upon by patient/family Yes              PT Short Term Goals - 04/15/14 1108    PT SHORT TERM GOAL #1   Title decrease necrotic tissue to 0   Time 2   Period Weeks   Status Partially Met   PT SHORT TERM GOAL #2   Title Granulation tissue to be increased to 100%   Time 2   Period Weeks   Status Partially Met           PT Long Term Goals - 04/15/14 1108    PT LONG TERM GOAL #1   Title Wound size decreased by 1.0 cm diameter   Time 4   Period Weeks   Status Partially Met   PT LONG TERM GOAL #2   Title Pt famility to verbalize importance of cleansing and changing dressing on a regular basis    Time 4   Period Weeks   Status Achieved   PT LONG TERM GOAL #3   Title no pain with sitting   Time 4   Period Weeks   Status Achieved                Problem List Patient Active Problem List   Diagnosis Date Noted  . HYPERLIPIDEMIA-MIXED 11/28/2008  . HYPERTENSION, UNSPECIFIED 11/28/2008  . CAD, NATIVE VESSEL 11/28/2008  . MURMUR 11/28/2008  . CONSTIPATION 11/26/2008  . HEMATOCHEZIA 09/08/2008  . WEIGHT LOSS, ABNORMAL 09/08/2008  . NAUSEA, CHRONIC 09/08/2008  . CHANGE IN BOWELS 09/08/2008   . ABDOMINAL PAIN OTHER SPECIFIED SITE 09/08/2008  . THYROID NODULE, HX OF 09/08/2008    Teena Irani, PTA/CLT [phone] 04/22/2014, 2:31 PM  [facility] [address] [place], [place], [address] Phone: [phone]   Fax:  [phone]

## 2014-04-28 ENCOUNTER — Ambulatory Visit (HOSPITAL_COMMUNITY): Payer: Commercial Managed Care - HMO | Admitting: Physical Therapy

## 2014-04-28 DIAGNOSIS — L89153 Pressure ulcer of sacral region, stage 3: Secondary | ICD-10-CM | POA: Diagnosis not present

## 2014-04-28 DIAGNOSIS — Z5189 Encounter for other specified aftercare: Secondary | ICD-10-CM | POA: Diagnosis not present

## 2014-04-28 DIAGNOSIS — E119 Type 2 diabetes mellitus without complications: Secondary | ICD-10-CM | POA: Diagnosis not present

## 2014-04-28 DIAGNOSIS — L8993 Pressure ulcer of unspecified site, stage 3: Secondary | ICD-10-CM

## 2014-04-28 NOTE — Therapy (Signed)
[place], [place], [address] Phone: [phone]   Fax:  [phone]  Wound Care Therapy Re-evaluation  Patient Details  Name: Travis Phillips MRN: [id number] Date of Birth: October 16, 1955 Referring Provider:  Elsie Lincoln, MD  Encounter Date: 04/28/2014      PT End of Session - 04/28/14 1227    Visit Number 20   Number of Visits 20   Date for PT Re-Evaluation 04/29/14   Authorization Type [employer]   Authorization - Visit Number 20   Authorization - Number of Visits 28   PT Start Time 1115   PT Stop Time 1145   PT Time Calculation (min) 30 min   Activity Tolerance Patient tolerated treatment well   Behavior During Therapy [facility] for tasks assessed/performed      Past Medical History  Diagnosis Date  . Hypertension   . Dizziness   . Headache(784.0)   . Spasticity   . Hypercholesteremia   . Acid reflux   . Superficial siderosis of central nervous system 2012    Past Surgical History  Procedure Laterality Date  . Spinal tumor     . Hernia repair      There were no vitals taken for this visit.  Visit Diagnosis:  Pressure ulcer stage III                 Wound Therapy - 04/28/14 1158    Subjective Caregiver reports they are almost closed.  States she thinks she can take care of them from now on.   Pain Assessment No/denies pain   Pressure Ulcer Properties Date First Assessed: 12/31/13 Time First Assessed: 0845 Location: Sacrum Staging: Stage III -  Full thickness tissue loss. Subcutaneous fat may be visible but bone, tendon or muscle are NOT exposed. Wound Description (Comments): Rt buttocks, largest.  initial wound   Dressing Type Impregnated gauze (bismuth)   Dressing Changed   Dressing Change Frequency Daily   State of Healing Epithelialized   Site / Wound Assessment Red   % Wound base Red or Granulating 100%   % Wound base Yellow 0%   Peri-wound Assessment Intact;Erythema  (blanchable)   Wound Length (cm) 0.2 cm   Wound Width (cm) 0.3 cm   Wound Depth (cm) 0 cm   Drainage Amount Minimal   Drainage Description Serous   Treatment Cleansed;Debridement (Selective)   Pressure Ulcer Properties Date First Assessed: 03/18/14 Time First Assessed: 1040 Location: Ischial tuberosity Location Orientation: Left Staging: Stage II -  Partial thickness loss of dermis presenting as a shallow open ulcer with a red, pink wound bed without slough. Wound Description (Comments): Lt ischial tuberosity Present on Admission: No   Dressing Type Impregnated gauze (bismuth)   Dressing Intact   Dressing Change Frequency Daily   State of Healing Epithelialized   Site / Wound Assessment Red   % Wound base Red or Granulating 100%   % Wound base Yellow 0%   Peri-wound Assessment Erythema (blanchable)   Wound Length (cm) 0.3 cm   Wound Width (cm) 0.2 cm   Wound Depth (cm) 0 cm   Margins Attached edges (approximated)   Drainage Amount Scant   Drainage Description Serosanguineous   Treatment Cleansed;Debridement (Selective)   Selective Debridement - Location Rt buttock and Lt ischial tuberosity wounds   Selective Debridement - Tools Used Scalpel   Selective Debridement - Tissue Removed adherent slough and callous   Wound Therapy - Clinical Statement Wounds are nearly  healed, 100% granulated and approximating well.  Caregiver is comfortable to continue care at home at this time.  Debrided remaining callous from Wound on Rt buttock; no debridement needed to Lt ischial tuberosity.     Factors Delaying/Impairing Wound Healing Diabetes Mellitus;Immobility;Multiple medical problems;Polypharmacy   Hydrotherapy Plan Debridement;Dressing change;Patient/family education   Wound Therapy - Frequency Other (comment)  1 X week   Wound Plan discharge to home care   Dressing  xeroform,gauze, medipore   Decrease Necrotic Tissue to 0   Decrease Necrotic Tissue - Progress Met   Increase Granulation Tissue  to 100   Increase Granulation Tissue - Progress Met   Decrease Length/Width/Depth by (cm) 1.0 cm   Decrease Length/Width/Depth - Progress Met   Improve Drainage Characteristics Other (comment)   Improve Drainage Characteristics - Progress Met   Patient/Family will be able to  self dress until totally healed   Patient/Family Instruction Goal - Progress Met   Additional Wound Therapy Goal Pt to be able to sit with comfort   Additional Wound Therapy Goal - Progress Progressing toward goal   Goals/treatment plan/discharge plan were made with and agreed upon by patient/family Yes                 PT Education - 2014-05-26 1216    Education provided Yes   Education Details Continue to limit sitting time to 15 minutes, increase actvitiy level.  Instructed to clease wound with spray keracleanse/gauze and continue with xeroform dressings daily.  Pt given remainder of supplies.   Person(s) Educated Patient;Caregiver(s)   Methods Explanation;Demonstration;Verbal cues   Comprehension Verbalized understanding          PT Short Term Goals - 05-26-14 1225    PT SHORT TERM GOAL #1   Title decrease necrotic tissue to 0   Time 2   Period Weeks   Status Achieved   PT SHORT TERM GOAL #2   Title Granulation tissue to be increased to 100%   Time 2   Period Weeks   Status Achieved           PT Long Term Goals - 05/26/2014 1225    PT LONG TERM GOAL #1   Title Wound size decreased by 1.0 cm diameter   Time 4   Period Weeks   Status Achieved   PT LONG TERM GOAL #2   Title Pt famility to verbalize importance of cleansing and changing dressing on a regular basis    Time 4   Period Weeks   Status Achieved   PT LONG TERM GOAL #3   Title no pain with sitting   Time 4   Period Weeks   Status Achieved              Problem List Patient Active Problem List   Diagnosis Date Noted  . HYPERLIPIDEMIA-MIXED 11/28/2008  . HYPERTENSION, UNSPECIFIED 11/28/2008  . CAD, NATIVE VESSEL  11/28/2008  . MURMUR 11/28/2008  . CONSTIPATION 11/26/2008  . HEMATOCHEZIA 09/08/2008  . WEIGHT LOSS, ABNORMAL 09/08/2008  . NAUSEA, CHRONIC 09/08/2008  . CHANGE IN BOWELS 09/08/2008  . ABDOMINAL PAIN OTHER SPECIFIED SITE 09/08/2008  . THYROID NODULE, HX OF 09/08/2008    Teena Irani, PTA/CLT [phone] 2014/05/26, 2:12 PM  [place], [place], [address] Phone: [phone]   Fax:  [phone]       G-Codes - 2014/05/26 1406    Functional Assessment Tool Used Skilled clinical assessment   Functional  Limitation Other PT subsequent;Other PT primary   Other PT Primary Goal Status [phone]) At least 1 percent but less than 20 percent impaired, limited or restricted   Other PT Primary Discharge Status [id number]) At least 1 percent but less than 20 percent impaired, limited or restricted   Other PT Secondary Goal Status ([id number]) At least 1 percent but less than 20 percent impaired, limited or restricted   Other PT Secondary Discharge Status (C0034) At least 1 percent but less than 20 percent impaired, limited or restricted       PHYSICAL THERAPY DISCHARGE SUMMARY  Visits from Start of Care: 20  Current functional level related to goals / functional outcomes: Patient is healing well and wounds have 100% granulation, show signs of good approximation. Per patient's caregiver, patient's wounds have reached the point where she feels comfortable in managing the wounds on her own. Refer to goals above.    Remaining deficits: Wounds are nearly healed, have not completely healed over but are at a point where patient's caregiver can manage them at home. No signs of infection present.    Education / Equipment: General education on process of wound healing and appropriate strategies for wound management at home. Education regarding activity level parameters and sitting limitations.  Plan: Patient agrees to discharge.  Patient goals  were met. Patient is being discharged due to meeting the stated rehab goals.  ?????        Deniece Ree PT, DPT [phone]

## 2014-04-28 NOTE — Therapy (Deleted)
[place] [address] [place], [place], [address] Phone: [phone]   Fax:  [phone]  Physical Therapy Treatment  Patient Details  Name: Travis Phillips MRN: [id number] Date of Birth: Jun 25, 1955 Referring Provider:  Elsie Lincoln, MD  Encounter Date: 04/28/2014    Past Medical History  Diagnosis Date  . Hypertension   . Dizziness   . Headache(784.0)   . Spasticity   . Hypercholesteremia   . Acid reflux   . Superficial siderosis of central nervous system 2012    Past Surgical History  Procedure Laterality Date  . Spinal tumor     . Hernia repair      There were no vitals taken for this visit.  Visit Diagnosis:  Pressure ulcer stage III                 Wound Therapy - 04/28/14 1158    Subjective Caregiver reports they are almost closed.  States she thinks she can take care of them from now on.   Pain Assessment No/denies pain   Pressure Ulcer Properties Date First Assessed: 12/31/13 Time First Assessed: 0845 Location: Sacrum Staging: Stage III -  Full thickness tissue loss. Subcutaneous fat may be visible but bone, tendon or muscle are NOT exposed. Wound Description (Comments): Rt buttocks, largest.  initial wound   Dressing Type Impregnated gauze (bismuth)   Dressing Changed   Dressing Change Frequency Daily   State of Healing Epithelialized   Site / Wound Assessment Red   % Wound base Red or Granulating 100%   % Wound base Yellow 0%   Peri-wound Assessment Intact;Erythema (blanchable)   Wound Length (cm) 0.2 cm   Wound Width (cm) 0.3 cm   Wound Depth (cm) 0 cm   Drainage Amount Minimal   Drainage Description Serous   Treatment Cleansed;Debridement (Selective)   Pressure Ulcer Properties Date First Assessed: 03/18/14 Time First Assessed: 1040 Location: Ischial tuberosity Location Orientation: Left Staging: Stage II -  Partial thickness loss of dermis presenting as a shallow open ulcer with a red, pink  wound bed without slough. Wound Description (Comments): Lt ischial tuberosity Present on Admission: No   Dressing Type Impregnated gauze (bismuth)   Dressing Intact   Dressing Change Frequency Daily   State of Healing Epithelialized   Site / Wound Assessment Red   % Wound base Red or Granulating 100%   % Wound base Yellow 0%   Peri-wound Assessment Erythema (blanchable)   Wound Length (cm) 0.3 cm   Wound Width (cm) 0.2 cm   Wound Depth (cm) 0 cm   Margins Attached edges (approximated)   Drainage Amount Scant   Drainage Description Serosanguineous   Treatment Cleansed;Debridement (Selective)   Selective Debridement - Location Rt buttock and Lt ischial tuberosity wounds   Selective Debridement - Tools Used Scalpel   Selective Debridement - Tissue Removed adherent slough and callous   Wound Therapy - Clinical Statement Wounds are nearly healed, 100% granulated and approximating well.  Caregiver is comfortable to continue care at home at this time.  Debrided remaining callous from Wound on Rt buttock; no debridement needed to Lt ischial tuberosity.     Factors Delaying/Impairing Wound Healing Diabetes Mellitus;Immobility;Multiple medical problems;Polypharmacy   Hydrotherapy Plan Debridement;Dressing change;Patient/family education   Wound Therapy - Frequency Other (comment)  1 X week   Wound Plan discharge to home care   Dressing  xeroform,gauze, medipore   Decrease Necrotic Tissue to 0   Decrease Necrotic Tissue -  Progress Met   Increase Granulation Tissue to 100   Increase Granulation Tissue - Progress Met   Decrease Length/Width/Depth by (cm) 1.0 cm   Decrease Length/Width/Depth - Progress Met   Improve Drainage Characteristics Other (comment)   Improve Drainage Characteristics - Progress Met   Patient/Family will be able to  self dress until totally healed   Patient/Family Instruction Goal - Progress Met   Additional Wound Therapy Goal Pt to be able to sit with comfort    Additional Wound Therapy Goal - Progress Progressing toward goal   Goals/treatment plan/discharge plan were made with and agreed upon by patient/family Yes                 PT Education - 04/28/14 1216    Education provided Yes   Education Details Continue to limit sitting time to 15 minutes, increase actvitiy level.  Instructed to clease wound with spray keracleanse/gauze and continue with xeroform dressings daily.  Pt given remainder of supplies.   Person(s) Educated Patient;Caregiver(s)   Methods Explanation;Demonstration;Verbal cues   Comprehension Verbalized understanding          PT Short Term Goals - 04/15/14 1108    PT SHORT TERM GOAL #1   Title decrease necrotic tissue to 0   Time 2   Period Weeks   Status Partially Met   PT SHORT TERM GOAL #2   Title Granulation tissue to be increased to 100%   Time 2   Period Weeks   Status Partially Met           PT Long Term Goals - 04/15/14 1108    PT LONG TERM GOAL #1   Title Wound size decreased by 1.0 cm diameter   Time 4   Period Weeks   Status Partially Met   PT LONG TERM GOAL #2   Title Pt famility to verbalize importance of cleansing and changing dressing on a regular basis    Time 4   Period Weeks   Status Achieved   PT LONG TERM GOAL #3   Title no pain with sitting   Time 4   Period Weeks   Status Achieved               Problem List Patient Active Problem List   Diagnosis Date Noted  . HYPERLIPIDEMIA-MIXED 11/28/2008  . HYPERTENSION, UNSPECIFIED 11/28/2008  . CAD, NATIVE VESSEL 11/28/2008  . MURMUR 11/28/2008  . CONSTIPATION 11/26/2008  . HEMATOCHEZIA 09/08/2008  . WEIGHT LOSS, ABNORMAL 09/08/2008  . NAUSEA, CHRONIC 09/08/2008  . CHANGE IN BOWELS 09/08/2008  . ABDOMINAL PAIN OTHER SPECIFIED SITE 09/08/2008  . THYROID NODULE, HX OF 09/08/2008    Teena Irani, PTA/CLT [phone] 04/28/2014, 12:23 PM  [place], [place], [address] Phone: [phone]   Fax:  [phone]

## 2014-04-29 DIAGNOSIS — L719 Rosacea, unspecified: Secondary | ICD-10-CM | POA: Diagnosis not present

## 2014-04-29 DIAGNOSIS — D485 Neoplasm of uncertain behavior of skin: Secondary | ICD-10-CM | POA: Diagnosis not present

## 2014-04-29 DIAGNOSIS — L219 Seborrheic dermatitis, unspecified: Secondary | ICD-10-CM | POA: Diagnosis not present

## 2014-04-29 DIAGNOSIS — L570 Actinic keratosis: Secondary | ICD-10-CM | POA: Diagnosis not present

## 2014-04-30 DIAGNOSIS — D225 Melanocytic nevi of trunk: Secondary | ICD-10-CM | POA: Diagnosis not present

## 2014-04-30 DIAGNOSIS — D3614 Benign neoplasm of peripheral nerves and autonomic nervous system of thorax: Secondary | ICD-10-CM | POA: Diagnosis not present

## 2014-05-06 ENCOUNTER — Ambulatory Visit (HOSPITAL_COMMUNITY): Payer: Commercial Managed Care - HMO | Admitting: Physical Therapy

## 2014-05-13 ENCOUNTER — Ambulatory Visit (HOSPITAL_COMMUNITY): Payer: Self-pay | Admitting: Physical Therapy

## 2014-05-20 ENCOUNTER — Ambulatory Visit (HOSPITAL_COMMUNITY)
Admission: RE | Admit: 2014-05-20 | Discharge: 2014-05-20 | Disposition: A | Discharge status: Home / Self Care | Payer: Commercial Managed Care - HMO | Source: Ambulatory Visit | Attending: Urology | Admitting: Urology

## 2014-05-20 ENCOUNTER — Ambulatory Visit (HOSPITAL_COMMUNITY): Payer: Self-pay | Admitting: Physical Therapy

## 2014-05-20 DIAGNOSIS — R310 Gross hematuria: Secondary | ICD-10-CM | POA: Diagnosis not present

## 2014-05-20 DIAGNOSIS — R934 Abnormal findings on diagnostic imaging of urinary organs: Secondary | ICD-10-CM | POA: Insufficient documentation

## 2014-05-20 DIAGNOSIS — I10 Essential (primary) hypertension: Secondary | ICD-10-CM | POA: Diagnosis not present

## 2014-05-20 DIAGNOSIS — Z87442 Personal history of urinary calculi: Secondary | ICD-10-CM | POA: Diagnosis not present

## 2014-05-25 DIAGNOSIS — L219 Seborrheic dermatitis, unspecified: Secondary | ICD-10-CM | POA: Diagnosis not present

## 2014-05-25 DIAGNOSIS — D485 Neoplasm of uncertain behavior of skin: Secondary | ICD-10-CM | POA: Diagnosis not present

## 2014-05-25 DIAGNOSIS — L570 Actinic keratosis: Secondary | ICD-10-CM | POA: Diagnosis not present

## 2014-05-25 DIAGNOSIS — C44311 Basal cell carcinoma of skin of nose: Secondary | ICD-10-CM | POA: Diagnosis not present

## 2014-06-02 ENCOUNTER — Ambulatory Visit (INDEPENDENT_AMBULATORY_CARE_PROVIDER_SITE_OTHER): Payer: Commercial Managed Care - HMO | Admitting: Urology

## 2014-06-02 DIAGNOSIS — N401 Enlarged prostate with lower urinary tract symptoms: Secondary | ICD-10-CM

## 2014-06-04 DIAGNOSIS — C44310 Basal cell carcinoma of skin of unspecified parts of face: Secondary | ICD-10-CM | POA: Diagnosis not present

## 2014-06-25 DIAGNOSIS — R252 Cramp and spasm: Secondary | ICD-10-CM | POA: Diagnosis not present

## 2014-06-25 DIAGNOSIS — I6789 Other cerebrovascular disease: Secondary | ICD-10-CM | POA: Diagnosis not present

## 2014-08-25 DIAGNOSIS — E782 Mixed hyperlipidemia: Secondary | ICD-10-CM | POA: Diagnosis not present

## 2014-08-25 DIAGNOSIS — R531 Weakness: Secondary | ICD-10-CM | POA: Diagnosis not present

## 2014-08-25 DIAGNOSIS — E1129 Type 2 diabetes mellitus with other diabetic kidney complication: Secondary | ICD-10-CM | POA: Diagnosis not present

## 2014-08-25 DIAGNOSIS — J634 Siderosis: Secondary | ICD-10-CM | POA: Diagnosis not present

## 2014-08-25 DIAGNOSIS — Z0001 Encounter for general adult medical examination with abnormal findings: Secondary | ICD-10-CM | POA: Diagnosis not present

## 2014-08-25 DIAGNOSIS — Z6827 Body mass index (BMI) 27.0-27.9, adult: Secondary | ICD-10-CM | POA: Diagnosis not present

## 2014-08-25 DIAGNOSIS — E1140 Type 2 diabetes mellitus with diabetic neuropathy, unspecified: Secondary | ICD-10-CM | POA: Diagnosis not present

## 2014-08-25 DIAGNOSIS — H9190 Unspecified hearing loss, unspecified ear: Secondary | ICD-10-CM | POA: Diagnosis not present

## 2014-08-25 DIAGNOSIS — E663 Overweight: Secondary | ICD-10-CM | POA: Diagnosis not present

## 2014-08-25 DIAGNOSIS — I10 Essential (primary) hypertension: Secondary | ICD-10-CM | POA: Diagnosis not present

## 2014-08-25 DIAGNOSIS — I629 Nontraumatic intracranial hemorrhage, unspecified: Secondary | ICD-10-CM | POA: Diagnosis not present

## 2014-08-25 DIAGNOSIS — N182 Chronic kidney disease, stage 2 (mild): Secondary | ICD-10-CM | POA: Diagnosis not present

## 2014-09-28 DIAGNOSIS — L719 Rosacea, unspecified: Secondary | ICD-10-CM | POA: Diagnosis not present

## 2014-09-28 DIAGNOSIS — L219 Seborrheic dermatitis, unspecified: Secondary | ICD-10-CM | POA: Diagnosis not present

## 2014-09-28 DIAGNOSIS — L570 Actinic keratosis: Secondary | ICD-10-CM | POA: Diagnosis not present

## 2014-09-28 DIAGNOSIS — Z85828 Personal history of other malignant neoplasm of skin: Secondary | ICD-10-CM | POA: Diagnosis not present

## 2014-10-06 ENCOUNTER — Ambulatory Visit (INDEPENDENT_AMBULATORY_CARE_PROVIDER_SITE_OTHER): Payer: Commercial Managed Care - HMO | Admitting: Urology

## 2014-10-06 DIAGNOSIS — N401 Enlarged prostate with lower urinary tract symptoms: Secondary | ICD-10-CM | POA: Diagnosis not present

## 2014-10-06 DIAGNOSIS — R390 Extravasation of urine: Secondary | ICD-10-CM | POA: Diagnosis not present

## 2014-10-06 DIAGNOSIS — R339 Retention of urine, unspecified: Secondary | ICD-10-CM | POA: Diagnosis not present

## 2014-10-19 ENCOUNTER — Emergency Department (HOSPITAL_COMMUNITY)
Admission: EM | Admit: 2014-10-19 | Discharge: 2014-10-19 | Disposition: A | Discharge status: Home / Self Care | Payer: Commercial Managed Care - HMO | Attending: Emergency Medicine | Admitting: Emergency Medicine

## 2014-10-19 ENCOUNTER — Emergency Department (HOSPITAL_COMMUNITY): Payer: Commercial Managed Care - HMO

## 2014-10-19 ENCOUNTER — Encounter (HOSPITAL_COMMUNITY): Payer: Self-pay | Admitting: Cardiology

## 2014-10-19 DIAGNOSIS — S20221A Contusion of right back wall of thorax, initial encounter: Secondary | ICD-10-CM | POA: Diagnosis not present

## 2014-10-19 DIAGNOSIS — K219 Gastro-esophageal reflux disease without esophagitis: Secondary | ICD-10-CM | POA: Insufficient documentation

## 2014-10-19 DIAGNOSIS — E780 Pure hypercholesterolemia: Secondary | ICD-10-CM | POA: Diagnosis not present

## 2014-10-19 DIAGNOSIS — R410 Disorientation, unspecified: Secondary | ICD-10-CM | POA: Diagnosis not present

## 2014-10-19 DIAGNOSIS — Z7982 Long term (current) use of aspirin: Secondary | ICD-10-CM | POA: Diagnosis not present

## 2014-10-19 DIAGNOSIS — Z87891 Personal history of nicotine dependence: Secondary | ICD-10-CM | POA: Insufficient documentation

## 2014-10-19 DIAGNOSIS — M549 Dorsalgia, unspecified: Secondary | ICD-10-CM | POA: Diagnosis not present

## 2014-10-19 DIAGNOSIS — S4991XA Unspecified injury of right shoulder and upper arm, initial encounter: Secondary | ICD-10-CM | POA: Insufficient documentation

## 2014-10-19 DIAGNOSIS — S299XXA Unspecified injury of thorax, initial encounter: Secondary | ICD-10-CM | POA: Diagnosis not present

## 2014-10-19 DIAGNOSIS — W01198A Fall on same level from slipping, tripping and stumbling with subsequent striking against other object, initial encounter: Secondary | ICD-10-CM | POA: Insufficient documentation

## 2014-10-19 DIAGNOSIS — S0990XA Unspecified injury of head, initial encounter: Secondary | ICD-10-CM | POA: Insufficient documentation

## 2014-10-19 DIAGNOSIS — T148XXA Other injury of unspecified body region, initial encounter: Secondary | ICD-10-CM

## 2014-10-19 DIAGNOSIS — I10 Essential (primary) hypertension: Secondary | ICD-10-CM | POA: Insufficient documentation

## 2014-10-19 DIAGNOSIS — S199XXA Unspecified injury of neck, initial encounter: Secondary | ICD-10-CM | POA: Insufficient documentation

## 2014-10-19 DIAGNOSIS — R0781 Pleurodynia: Secondary | ICD-10-CM | POA: Diagnosis not present

## 2014-10-19 DIAGNOSIS — M25511 Pain in right shoulder: Secondary | ICD-10-CM | POA: Diagnosis not present

## 2014-10-19 DIAGNOSIS — W19XXXA Unspecified fall, initial encounter: Secondary | ICD-10-CM

## 2014-10-19 DIAGNOSIS — Z79899 Other long term (current) drug therapy: Secondary | ICD-10-CM | POA: Insufficient documentation

## 2014-10-19 DIAGNOSIS — N390 Urinary tract infection, site not specified: Secondary | ICD-10-CM

## 2014-10-19 DIAGNOSIS — Y92410 Unspecified street and highway as the place of occurrence of the external cause: Secondary | ICD-10-CM | POA: Insufficient documentation

## 2014-10-19 DIAGNOSIS — Y9389 Activity, other specified: Secondary | ICD-10-CM | POA: Insufficient documentation

## 2014-10-19 DIAGNOSIS — M545 Low back pain: Secondary | ICD-10-CM | POA: Diagnosis not present

## 2014-10-19 DIAGNOSIS — Y998 Other external cause status: Secondary | ICD-10-CM | POA: Insufficient documentation

## 2014-10-19 DIAGNOSIS — S3991XA Unspecified injury of abdomen, initial encounter: Secondary | ICD-10-CM | POA: Diagnosis not present

## 2014-10-19 DIAGNOSIS — S3992XA Unspecified injury of lower back, initial encounter: Secondary | ICD-10-CM | POA: Diagnosis not present

## 2014-10-19 DIAGNOSIS — R51 Headache: Secondary | ICD-10-CM | POA: Diagnosis not present

## 2014-10-19 LAB — CBC WITH DIFFERENTIAL/PLATELET
BASOS ABS: 0 10*3/uL (ref 0.0–0.1)
BASOS PCT: 0 % (ref 0–1)
EOS ABS: 0.1 10*3/uL (ref 0.0–0.7)
EOS PCT: 2 % (ref 0–5)
HCT: 42.1 % (ref 39.0–52.0)
Hemoglobin: 13.7 g/dL (ref 13.0–17.0)
Lymphocytes Relative: 32 % (ref 12–46)
Lymphs Abs: 2.1 10*3/uL (ref 0.7–4.0)
MCH: 28.9 pg (ref 26.0–34.0)
MCHC: 32.5 g/dL (ref 30.0–36.0)
MCV: 88.8 fL (ref 78.0–100.0)
Monocytes Absolute: 0.4 10*3/uL (ref 0.1–1.0)
Monocytes Relative: 6 % (ref 3–12)
Neutro Abs: 3.9 10*3/uL (ref 1.7–7.7)
Neutrophils Relative %: 60 % (ref 43–77)
PLATELETS: 225 10*3/uL (ref 150–400)
RBC: 4.74 MIL/uL (ref 4.22–5.81)
RDW: 13.1 % (ref 11.5–15.5)
WBC: 6.5 10*3/uL (ref 4.0–10.5)

## 2014-10-19 LAB — COMPREHENSIVE METABOLIC PANEL
ALBUMIN: 4.2 g/dL (ref 3.5–5.0)
ALT: 12 U/L — AB (ref 17–63)
AST: 13 U/L — AB (ref 15–41)
Alkaline Phosphatase: 41 U/L (ref 38–126)
Anion gap: 6 (ref 5–15)
BUN: 28 mg/dL — ABNORMAL HIGH (ref 6–20)
CHLORIDE: 107 mmol/L (ref 101–111)
CO2: 31 mmol/L (ref 22–32)
CREATININE: 1.64 mg/dL — AB (ref 0.61–1.24)
Calcium: 10.7 mg/dL — ABNORMAL HIGH (ref 8.9–10.3)
GFR calc non Af Amer: 45 mL/min — ABNORMAL LOW (ref >60)
GFR, EST AFRICAN AMERICAN: 52 mL/min — AB (ref >60)
Glucose, Bld: 126 mg/dL — ABNORMAL HIGH (ref 65–99)
Potassium: 4.6 mmol/L (ref 3.5–5.1)
SODIUM: 144 mmol/L (ref 135–145)
Total Bilirubin: 0.3 mg/dL (ref 0.3–1.2)
Total Protein: 7 g/dL (ref 6.5–8.1)

## 2014-10-19 LAB — URINALYSIS, ROUTINE W REFLEX MICROSCOPIC
Bilirubin Urine: NEGATIVE
Glucose, UA: NEGATIVE mg/dL
Ketones, ur: NEGATIVE mg/dL
Nitrite: POSITIVE — AB
Protein, ur: NEGATIVE mg/dL
SPECIFIC GRAVITY, URINE: 1.025 (ref 1.005–1.030)
UROBILINOGEN UA: 0.2 mg/dL (ref 0.0–1.0)
pH: 5.5 (ref 5.0–8.0)

## 2014-10-19 LAB — URINE MICROSCOPIC-ADD ON

## 2014-10-19 MED ORDER — CEFUROXIME AXETIL 250 MG PO TABS: 250.0000 mg | ORAL_TABLET | Freq: Two times a day (BID) | ORAL | Status: DC

## 2014-10-19 MED ORDER — DEXTROSE 5 % IV SOLN
1.0000 g | Freq: Once | INTRAVENOUS | Status: AC
  Administered 2014-10-19: 1 g via INTRAVENOUS
  Filled 2014-10-19: qty 10

## 2014-10-19 NOTE — ED Notes (Signed)
Fall yesterday.  Pt states he did hit his head.  Per caretaker pt falls a lot and has been more confused today.

## 2014-10-19 NOTE — Discharge Instructions (Signed)
Contusion A contusion is a deep bruise. Contusions happen when an injury causes bleeding under the skin. Signs of bruising include pain, puffiness (swelling), and discolored skin. The contusion may turn blue, purple, or yellow. HOME CARE   Put ice on the injured area.  Put ice in a plastic bag.  Place a towel between your skin and the bag.  Leave the ice on for 15-20 minutes, 03-04 times a day.  Only take medicine as told by your doctor.  Rest the injured area.  If possible, raise (elevate) the injured area to lessen puffiness. GET HELP RIGHT AWAY IF:   You have more bruising or puffiness.  You have pain that is getting worse.  Your puffiness or pain is not helped by medicine. MAKE SURE YOU:   Understand these instructions.  Will watch your condition.  Will get help right away if you are not doing well or get worse. Document Released: 07/26/2007 Document Revised: 05/01/2011 Document Reviewed: 12/12/2010 [facility] Patient Information 2015 [place], [place]. This information is not intended to replace advice given to you by your health care provider. Make sure you discuss any questions you have with your health care provider.  Urinary Tract Infection Urinary tract infections (UTIs) can develop anywhere along your urinary tract. Your urinary tract is your body's drainage system for removing wastes and extra water. Your urinary tract includes two kidneys, two ureters, a bladder, and a urethra. Your kidneys are a pair of bean-shaped organs. Each kidney is about the size of your fist. They are located below your ribs, one on each side of your spine. CAUSES Infections are caused by microbes, which are microscopic organisms, including fungi, viruses, and bacteria. These organisms are so small that they can only be seen through a microscope. Bacteria are the microbes that most commonly cause UTIs. SYMPTOMS  Symptoms of UTIs may vary by age and gender of the patient and by the location of  the infection. Symptoms in young women typically include a frequent and intense urge to urinate and a painful, burning feeling in the bladder or urethra during urination. Older women and men are more likely to be tired, shaky, and weak and have muscle aches and abdominal pain. A fever may mean the infection is in your kidneys. Other symptoms of a kidney infection include pain in your back or sides below the ribs, nausea, and vomiting. DIAGNOSIS To diagnose a UTI, your caregiver will ask you about your symptoms. Your caregiver also will ask to provide a urine sample. The urine sample will be tested for bacteria and white blood cells. White blood cells are made by your body to help fight infection. TREATMENT  Typically, UTIs can be treated with medication. Because most UTIs are caused by a bacterial infection, they usually can be treated with the use of antibiotics. The choice of antibiotic and length of treatment depend on your symptoms and the type of bacteria causing your infection. HOME CARE INSTRUCTIONS  If you were prescribed antibiotics, take them exactly as your caregiver instructs you. Finish the medication even if you feel better after you have only taken some of the medication.  Drink enough water and fluids to keep your urine clear or pale yellow.  Avoid caffeine, tea, and carbonated beverages. They tend to irritate your bladder.  Empty your bladder often. Avoid holding urine for long periods of time.  Empty your bladder before and after sexual intercourse.  After a bowel movement, women should cleanse from front to back. Use each  tissue only once. SEEK MEDICAL CARE IF:   You have back pain.  You develop a fever.  Your symptoms do not begin to resolve within 3 days. SEEK IMMEDIATE MEDICAL CARE IF:   You have severe back pain or lower abdominal pain.  You develop chills.  You have nausea or vomiting.  You have continued burning or discomfort with urination. MAKE SURE YOU:     Understand these instructions.  Will watch your condition.  Will get help right away if you are not doing well or get worse. Document Released: 11/16/2004 Document Revised: 08/08/2011 Document Reviewed: 03/17/2011 [facility] Patient Information 2015 [place], [place]. This information is not intended to replace advice given to you by your health care provider. Make sure you discuss any questions you have with your health care provider.

## 2014-10-19 NOTE — ED Provider Notes (Signed)
CSN: [id number]     Arrival date & time 10/19/14  1753 History   First MD Initiated Contact with Patient 10/19/14 1847     Chief Complaint  Patient presents with  . Fall  . Altered Mental Status     (Consider location/radiation/quality/duration/timing/severity/associated sxs/prior Treatment) HPI Comments: Patient presents to the ER for evaluation of confusion. Patient reportedly fell yesterday and hit his head. He is complaining of mild headache and neck pain. He has complained of right shoulder pain and right flank pain as well since the fall. Caretaker has noticed a large bruise on the right side of his upper back. He seems slower to respond and more confused than usual today.  Patient is a 59 y.o. male presenting with fall and altered mental status.  Fall Associated symptoms include headaches.  Altered Mental Status Presenting symptoms: confusion   Associated symptoms: headaches     Past Medical History  Diagnosis Date  . Hypertension   . Dizziness   . Headache(784.0)   . Spasticity   . Hypercholesteremia   . Acid reflux   . Superficial siderosis of central nervous system 2012   Past Surgical History  Procedure Laterality Date  . Spinal tumor     . Hernia repair     History reviewed. No pertinent family history. Social History  Substance Use Topics  . Smoking status: Former Research scientist (life sciences)  . Smokeless tobacco: None  . Alcohol Use: No    Review of Systems  Musculoskeletal: Positive for arthralgias.  Neurological: Positive for headaches.  Psychiatric/Behavioral: Positive for confusion.  All other systems reviewed and are negative.     Allergies  Review of patient's allergies indicates no known allergies.  Home Medications   Prior to Admission medications   Medication Sig Start Date End Date Taking? Authorizing Provider  amitriptyline (ELAVIL) 10 MG tablet Take 10-20 mg by mouth 2 (two) times daily. 1 in am, 2 in pm    Historical Provider, MD  aspirin EC 81 MG  tablet Take 81 mg by mouth daily.    Historical Provider, MD  baclofen (LIORESAL) 10 MG tablet Take 10 mg by mouth 2 (two) times daily.     Historical Provider, MD  carbamazepine (TEGRETOL) 200 MG tablet Take 300 mg by mouth 2 (two) times daily.    Historical Provider, MD  HYDROcodone-acetaminophen (NORCO/VICODIN) 5-325 MG per tablet Take 1 tablet by mouth every 4 (four) hours as needed. 09/28/13   Evalee Jefferson, PA-C  meclizine (ANTIVERT) 12.5 MG tablet Take 12.5 mg by mouth 3 (three) times daily as needed. For dizziness    Historical Provider, MD  metoprolol (LOPRESSOR) 50 MG tablet Take 50 mg by mouth 2 (two) times daily.      Historical Provider, MD  omeprazole (PRILOSEC) 20 MG capsule Take 20 mg by mouth 2 (two) times daily.      Historical Provider, MD  pravastatin (PRAVACHOL) 40 MG tablet Take 40 mg by mouth daily.      Historical Provider, MD  PRESCRIPTION MEDICATION Apply 1 application topically daily as needed. Face cream for rosacea.    Historical Provider, MD   BP 147/83 mmHg  Pulse 89  Temp(Src) 97.5 F (36.4 C) (Oral)  Resp 16  Ht 6' (1.829 m)  Wt 210 lb (95.255 kg)  BMI 28.47 kg/m2  SpO2 98% Physical Exam  Constitutional: He appears well-developed and well-nourished. No distress.  HENT:  Head: Normocephalic and atraumatic.  Right Ear: Hearing normal.  Left Ear: Hearing normal.  Nose:  Nose normal.  Mouth/Throat: Oropharynx is clear and moist and mucous membranes are normal.  Eyes: Conjunctivae and EOM are normal. Pupils are equal, round, and reactive to light.  Neck: Normal range of motion. Neck supple.  Cardiovascular: Regular rhythm, S1 normal and S2 normal.  Exam reveals no gallop and no friction rub.   No murmur heard. Pulmonary/Chest: Effort normal and breath sounds normal. No respiratory distress. He exhibits no tenderness.  Abdominal: Soft. Normal appearance and bowel sounds are normal. There is no hepatosplenomegaly. There is no tenderness. There is no rebound, no  guarding, no tenderness at McBurney's point and negative Murphy's sign. No hernia.  Musculoskeletal: Normal range of motion.       Back:  Neurological: He is alert. He has normal strength. No cranial nerve deficit or sensory deficit. Coordination normal. GCS eye subscore is 4. GCS verbal subscore is 5. GCS motor subscore is 6.  Skin: Skin is warm, dry and intact. Ecchymosis noted. No rash noted. No cyanosis.     Psychiatric: He has a normal mood and affect. His speech is normal and behavior is normal. Thought content normal.  Nursing note and vitals reviewed.   ED Course  Procedures (including critical care time) Labs Review Labs Reviewed  COMPREHENSIVE METABOLIC PANEL - Abnormal; Notable for the following:    Glucose, Bld 126 (*)    BUN 28 (*)    Creatinine, Ser 1.64 (*)    Calcium 10.7 (*)    AST 13 (*)    ALT 12 (*)    GFR calc non Af Amer 45 (*)    GFR calc Af Amer 52 (*)    All other components within normal limits  URINALYSIS, ROUTINE W REFLEX MICROSCOPIC (NOT AT [facility]) - Abnormal; Notable for the following:    APPearance CLOUDY (*)    Hgb urine dipstick TRACE (*)    Nitrite POSITIVE (*)    Leukocytes, UA SMALL (*)    All other components within normal limits  URINE MICROSCOPIC-ADD ON - Abnormal; Notable for the following:    Bacteria, UA FEW (*)    All other components within normal limits  URINE CULTURE  CBC WITH DIFFERENTIAL/PLATELET    Imaging Review Dg Ribs Unilateral W/chest Right  10/19/2014   CLINICAL DATA:  Fall, right rib pain  EXAM: RIGHT RIBS AND CHEST - 3+ VIEW  COMPARISON:  None.  FINDINGS: No fracture or other bone lesions are seen involving the ribs. There is no evidence of pneumothorax or pleural effusion. Both lungs are clear. Heart size and mediastinal contours are within normal limits.  IMPRESSION: Negative.   Electronically Signed   By: Conchita Paris M.D.   On: 10/19/2014 20:15   Dg Thoracic Spine W/swimmers  10/19/2014   CLINICAL DATA:  Patient  fell at home 3 times this week complaining of upper back pain  EXAM: THORACIC SPINE - 3 VIEWS  COMPARISON:  None.  FINDINGS: Mild sigmoid scoliosis. No paraspinous hematoma or fracture identified. T1 and T2 are not characterized well on any of the images however  IMPRESSION: Scoliosis. T1 and T2 not well characterized. Correlate for point tenderness and consider further imaging if there is point tenderness at these levels.   Electronically Signed   By: Skipper Cliche M.D.   On: 10/19/2014 20:16   Dg Lumbar Spine Complete  10/19/2014   CLINICAL DATA:  Patient has sustained 3 separate falls at home in the past week, including earlier today. He complains of right low back pain.  EXAM: LUMBAR SPINE - COMPLETE 4+ VIEW  COMPARISON:  None.  FINDINGS: Five non rib-bearing lumbar vertebrae with anatomic alignment. No visible fractures. 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 Well preserved disc spaces. 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 Mild facet degenerative changes on the right at L4-5 and L5-S1. Visualized sacroiliac joints intact.  IMPRESSION: 1. No acute osseous abnormality. 2. 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 3. Facet degenerative changes on the right at L4-5 and L5-S1.   Electronically Signed   By: Evangeline Dakin M.D.   On: 10/19/2014 20:17   Dg Shoulder Right  10/19/2014   CLINICAL DATA:  Status post fall 3 times, with right shoulder pain. Initial encounter.  EXAM: RIGHT SHOULDER - 2+ VIEW  COMPARISON:  Right humerus radiographs performed 04/07/2011  FINDINGS: There is no evidence of fracture or dislocation. The right humeral head is seated within the glenoid fossa. The acromioclavicular joint is unremarkable in appearance. No significant soft tissue abnormalities are seen. The visualized portions of the right lung are clear.  IMPRESSION: No evidence of fracture or dislocation.   Electronically Signed   By: Garald Balding M.D.   On: 10/19/2014 20:18   Ct Head Wo  Contrast  10/19/2014   CLINICAL DATA:  [place].  Hit head yesterday.  EXAM: CT HEAD WITHOUT CONTRAST  CT CERVICAL SPINE WITHOUT CONTRAST  TECHNIQUE: Multidetector CT imaging of the head and cervical spine was performed following the standard protocol without intravenous contrast. Multiplanar CT image reconstructions of the cervical spine were also generated.  COMPARISON:  04/07/2011  FINDINGS: CT HEAD FINDINGS  The ventricles are normal in size and configuration. No extra-axial fluid collections are identified. The gray-white differentiation is normal. No CT findings for acute intracranial process such as hemorrhage or infarction. No mass lesions. The brainstem and cerebellum are grossly normal.  The bony structures are intact. The paranasal sinuses and mastoid air cells are clear. The globes are intact.  CT CERVICAL SPINE FINDINGS  Degenerative cervical spondylosis with multilevel disc disease and facet disease. Bridging osteophytes noted at C6-7 and C7-T1. Evidence of remote surgery with wide decompressive laminectomies in the lower cervical spine. No acute fractures identified. No canal compromise. Advanced facet disease and uncinate spurring contributing to mild multilevel foraminal narrowing.  The lung apices are clear.  IMPRESSION: 1. No acute intracranial findings or skull fracture. 2. Degenerative cervical spondylosis and postoperative changes but no acute fracture or canal compromise.   Electronically Signed   By: Marijo Sanes M.D.   On: 10/19/2014 20:30   Ct Cervical Spine Wo Contrast  10/19/2014   CLINICAL DATA:  [place].  Hit head yesterday.  EXAM: CT HEAD WITHOUT CONTRAST  CT CERVICAL SPINE WITHOUT CONTRAST  TECHNIQUE: Multidetector CT imaging of the head and cervical spine was performed following the standard protocol without intravenous contrast. Multiplanar CT image reconstructions of the cervical spine were also generated.  COMPARISON:  04/07/2011  FINDINGS: CT HEAD FINDINGS  The ventricles are  normal in size and configuration. No extra-axial fluid collections are identified. The gray-white differentiation is normal. No CT findings for acute intracranial process such as hemorrhage or infarction. No mass lesions. The brainstem and cerebellum are grossly normal.  The bony structures are intact. The paranasal sinuses and mastoid air cells are clear. The globes are intact.  CT CERVICAL SPINE FINDINGS  Degenerative cervical spondylosis with multilevel disc disease and facet disease. Bridging osteophytes noted at C6-7 and C7-T1. Evidence of remote surgery with  wide decompressive laminectomies in the lower cervical spine. No acute fractures identified. No canal compromise. Advanced facet disease and uncinate spurring contributing to mild multilevel foraminal narrowing.  The lung apices are clear.  IMPRESSION: 1. No acute intracranial findings or skull fracture. 2. Degenerative cervical spondylosis and postoperative changes but no acute fracture or canal compromise.   Electronically Signed   By: Marijo Sanes M.D.   On: 10/19/2014 20:30   I have personally reviewed and evaluated these images and lab results as part of my medical decision-making.   EKG Interpretation   Date/Time:  Monday October 19 2014 18:47:39 EDT Ventricular Rate:  91 PR Interval:  170 QRS Duration: 85 QT Interval:  331 QTC Calculation: 407 R Axis:   43 Text Interpretation:  Sinus rhythm Normal ECG Confirmed by Richards Pherigo  MD,  Lene Mckay [phone]) on 10/19/2014 8:34:32 PM      MDM   Final diagnoses:  Contusion  UTI (lower urinary tract infection)    Patient presents to the ER for evaluation of increased confusion. Patient reportedly has been weaker than usual and more confused. This caused him to fall yesterday. Patient has a contusion on his right posterior ribs and is complaining of headache, neck pain, back pain from the fall. CT head, CT cervical spine, x-rays are unremarkable. Blood work was normal. Patient does have  obvious urinary tract infection by urinalysis. Treat with Rocephin and Ceftin, culture pending. Appropriate for discharge and follow-up with PCP.    Orpah Greek, MD 10/19/14 2036

## 2014-10-22 LAB — URINE CULTURE

## 2014-12-29 DIAGNOSIS — I6789 Other cerebrovascular disease: Secondary | ICD-10-CM | POA: Diagnosis not present

## 2014-12-29 DIAGNOSIS — E785 Hyperlipidemia, unspecified: Secondary | ICD-10-CM | POA: Diagnosis not present

## 2014-12-29 DIAGNOSIS — I10 Essential (primary) hypertension: Secondary | ICD-10-CM | POA: Diagnosis not present

## 2014-12-29 DIAGNOSIS — Z5181 Encounter for therapeutic drug level monitoring: Secondary | ICD-10-CM | POA: Diagnosis not present

## 2014-12-29 DIAGNOSIS — E119 Type 2 diabetes mellitus without complications: Secondary | ICD-10-CM | POA: Diagnosis not present

## 2014-12-29 DIAGNOSIS — Z7984 Long term (current) use of oral hypoglycemic drugs: Secondary | ICD-10-CM | POA: Diagnosis not present

## 2014-12-29 DIAGNOSIS — E1169 Type 2 diabetes mellitus with other specified complication: Secondary | ICD-10-CM | POA: Diagnosis not present

## 2014-12-29 DIAGNOSIS — K5900 Constipation, unspecified: Secondary | ICD-10-CM | POA: Diagnosis not present

## 2014-12-29 DIAGNOSIS — H9193 Unspecified hearing loss, bilateral: Secondary | ICD-10-CM | POA: Diagnosis not present

## 2014-12-29 DIAGNOSIS — Z79899 Other long term (current) drug therapy: Secondary | ICD-10-CM | POA: Diagnosis not present

## 2014-12-29 DIAGNOSIS — H9190 Unspecified hearing loss, unspecified ear: Secondary | ICD-10-CM | POA: Diagnosis not present

## 2014-12-29 DIAGNOSIS — R252 Cramp and spasm: Secondary | ICD-10-CM | POA: Diagnosis not present

## 2014-12-29 DIAGNOSIS — N319 Neuromuscular dysfunction of bladder, unspecified: Secondary | ICD-10-CM | POA: Diagnosis not present

## 2015-01-25 DIAGNOSIS — Z6828 Body mass index (BMI) 28.0-28.9, adult: Secondary | ICD-10-CM | POA: Diagnosis not present

## 2015-01-25 DIAGNOSIS — K5900 Constipation, unspecified: Secondary | ICD-10-CM | POA: Diagnosis not present

## 2015-01-25 DIAGNOSIS — L719 Rosacea, unspecified: Secondary | ICD-10-CM | POA: Diagnosis not present

## 2015-01-25 DIAGNOSIS — E1140 Type 2 diabetes mellitus with diabetic neuropathy, unspecified: Secondary | ICD-10-CM | POA: Diagnosis not present

## 2015-01-25 DIAGNOSIS — E663 Overweight: Secondary | ICD-10-CM | POA: Diagnosis not present

## 2015-01-25 DIAGNOSIS — E782 Mixed hyperlipidemia: Secondary | ICD-10-CM | POA: Diagnosis not present

## 2015-01-25 DIAGNOSIS — N400 Enlarged prostate without lower urinary tract symptoms: Secondary | ICD-10-CM | POA: Diagnosis not present

## 2015-01-25 DIAGNOSIS — I10 Essential (primary) hypertension: Secondary | ICD-10-CM | POA: Diagnosis not present

## 2015-02-02 DIAGNOSIS — L570 Actinic keratosis: Secondary | ICD-10-CM | POA: Diagnosis not present

## 2015-02-02 DIAGNOSIS — L719 Rosacea, unspecified: Secondary | ICD-10-CM | POA: Diagnosis not present

## 2015-02-02 DIAGNOSIS — L219 Seborrheic dermatitis, unspecified: Secondary | ICD-10-CM | POA: Diagnosis not present

## 2015-02-02 DIAGNOSIS — Z85828 Personal history of other malignant neoplasm of skin: Secondary | ICD-10-CM | POA: Diagnosis not present

## 2015-04-13 ENCOUNTER — Ambulatory Visit (INDEPENDENT_AMBULATORY_CARE_PROVIDER_SITE_OTHER): Payer: Commercial Managed Care - HMO | Admitting: Urology

## 2015-04-13 DIAGNOSIS — N401 Enlarged prostate with lower urinary tract symptoms: Secondary | ICD-10-CM | POA: Diagnosis not present

## 2015-04-13 DIAGNOSIS — N201 Calculus of ureter: Secondary | ICD-10-CM | POA: Diagnosis not present

## 2015-04-13 DIAGNOSIS — N390 Urinary tract infection, site not specified: Secondary | ICD-10-CM | POA: Diagnosis not present

## 2015-04-13 DIAGNOSIS — N312 Flaccid neuropathic bladder, not elsewhere classified: Secondary | ICD-10-CM

## 2015-06-01 DIAGNOSIS — E538 Deficiency of other specified B group vitamins: Secondary | ICD-10-CM | POA: Diagnosis not present

## 2015-06-01 DIAGNOSIS — G968 Other specified disorders of central nervous system: Secondary | ICD-10-CM | POA: Diagnosis not present

## 2015-06-01 DIAGNOSIS — N319 Neuromuscular dysfunction of bladder, unspecified: Secondary | ICD-10-CM | POA: Diagnosis not present

## 2015-06-01 DIAGNOSIS — I10 Essential (primary) hypertension: Secondary | ICD-10-CM | POA: Diagnosis not present

## 2015-06-01 DIAGNOSIS — E119 Type 2 diabetes mellitus without complications: Secondary | ICD-10-CM | POA: Diagnosis not present

## 2015-06-01 DIAGNOSIS — H9190 Unspecified hearing loss, unspecified ear: Secondary | ICD-10-CM | POA: Diagnosis not present

## 2015-06-01 DIAGNOSIS — E785 Hyperlipidemia, unspecified: Secondary | ICD-10-CM | POA: Diagnosis not present

## 2015-06-01 DIAGNOSIS — R252 Cramp and spasm: Secondary | ICD-10-CM | POA: Diagnosis not present

## 2015-06-01 DIAGNOSIS — Z7982 Long term (current) use of aspirin: Secondary | ICD-10-CM | POA: Diagnosis not present

## 2015-06-01 DIAGNOSIS — I6789 Other cerebrovascular disease: Secondary | ICD-10-CM | POA: Diagnosis not present

## 2015-06-16 DIAGNOSIS — J634 Siderosis: Secondary | ICD-10-CM | POA: Diagnosis not present

## 2015-06-16 DIAGNOSIS — E119 Type 2 diabetes mellitus without complications: Secondary | ICD-10-CM | POA: Diagnosis not present

## 2015-06-16 DIAGNOSIS — I872 Venous insufficiency (chronic) (peripheral): Secondary | ICD-10-CM | POA: Diagnosis not present

## 2015-06-16 DIAGNOSIS — E663 Overweight: Secondary | ICD-10-CM | POA: Diagnosis not present

## 2015-06-16 DIAGNOSIS — Z6827 Body mass index (BMI) 27.0-27.9, adult: Secondary | ICD-10-CM | POA: Diagnosis not present

## 2015-06-16 DIAGNOSIS — Z1389 Encounter for screening for other disorder: Secondary | ICD-10-CM | POA: Diagnosis not present

## 2015-07-21 ENCOUNTER — Encounter (HOSPITAL_COMMUNITY): Payer: Self-pay

## 2015-07-21 ENCOUNTER — Emergency Department (HOSPITAL_COMMUNITY)
Admission: EM | Admit: 2015-07-21 | Discharge: 2015-07-21 | Disposition: A | Discharge status: Home / Self Care | Payer: Commercial Managed Care - HMO | Attending: Emergency Medicine | Admitting: Emergency Medicine

## 2015-07-21 DIAGNOSIS — I10 Essential (primary) hypertension: Secondary | ICD-10-CM | POA: Insufficient documentation

## 2015-07-21 DIAGNOSIS — Z7982 Long term (current) use of aspirin: Secondary | ICD-10-CM | POA: Insufficient documentation

## 2015-07-21 DIAGNOSIS — N390 Urinary tract infection, site not specified: Secondary | ICD-10-CM | POA: Insufficient documentation

## 2015-07-21 DIAGNOSIS — Z87891 Personal history of nicotine dependence: Secondary | ICD-10-CM | POA: Diagnosis not present

## 2015-07-21 DIAGNOSIS — Z79899 Other long term (current) drug therapy: Secondary | ICD-10-CM | POA: Insufficient documentation

## 2015-07-21 DIAGNOSIS — Z7984 Long term (current) use of oral hypoglycemic drugs: Secondary | ICD-10-CM | POA: Diagnosis not present

## 2015-07-21 DIAGNOSIS — R410 Disorientation, unspecified: Secondary | ICD-10-CM | POA: Diagnosis not present

## 2015-07-21 DIAGNOSIS — R4182 Altered mental status, unspecified: Secondary | ICD-10-CM | POA: Diagnosis present

## 2015-07-21 LAB — BASIC METABOLIC PANEL
Anion gap: 8 (ref 5–15)
BUN: 23 mg/dL — AB (ref 6–20)
CALCIUM: 10.4 mg/dL — AB (ref 8.9–10.3)
CHLORIDE: 108 mmol/L (ref 101–111)
CO2: 25 mmol/L (ref 22–32)
CREATININE: 1.08 mg/dL (ref 0.61–1.24)
GFR calc non Af Amer: >60 mL/min (ref >60)
Glucose, Bld: 176 mg/dL — ABNORMAL HIGH (ref 65–99)
Potassium: 3.9 mmol/L (ref 3.5–5.1)
SODIUM: 141 mmol/L (ref 135–145)

## 2015-07-21 LAB — URINALYSIS, ROUTINE W REFLEX MICROSCOPIC
BILIRUBIN URINE: NEGATIVE
Glucose, UA: 100 mg/dL — AB
KETONES UR: NEGATIVE mg/dL
NITRITE: NEGATIVE
PH: 6 (ref 5.0–8.0)
Protein, ur: NEGATIVE mg/dL
SPECIFIC GRAVITY, URINE: 1.015 (ref 1.005–1.030)

## 2015-07-21 LAB — CBC WITH DIFFERENTIAL/PLATELET
BASOS PCT: 0 %
Basophils Absolute: 0 10*3/uL (ref 0.0–0.1)
EOS ABS: 0 10*3/uL (ref 0.0–0.7)
Eosinophils Relative: 0 %
HCT: 40.6 % (ref 39.0–52.0)
Hemoglobin: 12.8 g/dL — ABNORMAL LOW (ref 13.0–17.0)
LYMPHS ABS: 1.6 10*3/uL (ref 0.7–4.0)
Lymphocytes Relative: 20 %
MCH: 28.7 pg (ref 26.0–34.0)
MCHC: 31.5 g/dL (ref 30.0–36.0)
MCV: 91 fL (ref 78.0–100.0)
MONO ABS: 0.4 10*3/uL (ref 0.1–1.0)
MONOS PCT: 5 %
Neutro Abs: 6.1 10*3/uL (ref 1.7–7.7)
Neutrophils Relative %: 75 %
Platelets: 248 10*3/uL (ref 150–400)
RBC: 4.46 MIL/uL (ref 4.22–5.81)
RDW: 14.2 % (ref 11.5–15.5)
WBC: 8.1 10*3/uL (ref 4.0–10.5)

## 2015-07-21 LAB — URINE MICROSCOPIC-ADD ON

## 2015-07-21 LAB — CBG MONITORING, ED: Glucose-Capillary: 139 mg/dL — ABNORMAL HIGH (ref 65–99)

## 2015-07-21 MED ORDER — CIPROFLOXACIN HCL 500 MG PO TABS: 500.0000 mg | ORAL_TABLET | Freq: Two times a day (BID) | ORAL | Status: DC

## 2015-07-21 MED ORDER — DEXTROSE 5 % IV SOLN
1.0000 g | Freq: Once | INTRAVENOUS | Status: AC
  Administered 2015-07-21: 1 g via INTRAVENOUS
  Filled 2015-07-21: qty 10

## 2015-07-21 MED ORDER — CIPROFLOXACIN HCL 250 MG PO TABS
500.0000 mg | ORAL_TABLET | Freq: Once | ORAL | Status: AC
  Administered 2015-07-21: 500 mg via ORAL
  Filled 2015-07-21: qty 2

## 2015-07-21 NOTE — ED Notes (Signed)
Son reports he noticed pt was confused yesterday but worse today.  Reports has history of UTI and reports had similar behavior when he has a UTI in the past.  Denies any other symptoms.  CBG was 68 per ems.  Wife gave pt a pepsi and a peanut butter and jelly sandwich before coming to ER.

## 2015-07-21 NOTE — ED Notes (Signed)
MD at the bedside  

## 2015-07-21 NOTE — ED Notes (Signed)
Pt family pt is more confused, pt can not hear, pt is alert and smiling

## 2015-07-21 NOTE — Discharge Instructions (Signed)

## 2015-07-21 NOTE — ED Provider Notes (Signed)
CSN: [id number]     Arrival date & time 07/21/15  1531 History   First MD Initiated Contact with Patient 07/21/15 1833     Chief Complaint  Patient presents with  . Altered Mental Status      HPI  Patient resents for evaluation of a possible urinary tract infection, and some confusion. Patient presents with his common-law wife. She states that he seemed "a little confused" this morning. She states this is happened in the past with urinary tract infections. He was up to the bathroom twice this morning. On his first episode was having "trouble" urinating. He's not had fever chills nausea or vomiting.  They checked his blood sugar this morning and it was 130.  The EMS came to the house today and his blood sugar was 68. He ate a sandwich and drank a soda and presents here.  Past Medical History  Diagnosis Date  . Hypertension   . Dizziness   . Headache(784.0)   . Spasticity   . Hypercholesteremia   . Acid reflux   . Superficial siderosis of central nervous system 2012   Past Surgical History  Procedure Laterality Date  . Spinal tumor     . Hernia repair     No family history on file. Social History  Substance Use Topics  . Smoking status: Former Research scientist (life sciences)  . Smokeless tobacco: None  . Alcohol Use: No    Review of Systems  Constitutional: Negative for fever, chills, diaphoresis, appetite change and fatigue.  HENT: Negative for mouth sores, sore throat and trouble swallowing.   Eyes: Negative for visual disturbance.  Respiratory: Negative for cough, chest tightness, shortness of breath and wheezing.   Cardiovascular: Negative for chest pain.  Gastrointestinal: Negative for nausea, vomiting, abdominal pain, diarrhea and abdominal distention.  Endocrine: Negative for polydipsia, polyphagia and polyuria.  Genitourinary: Positive for difficulty urinating. Negative for dysuria, frequency and hematuria.  Musculoskeletal: Negative for gait problem.  Skin: Negative for color change,  pallor and rash.  Neurological: Negative for dizziness, syncope, light-headedness and headaches.  Hematological: Does not bruise/bleed easily.  Psychiatric/Behavioral: Positive for confusion. Negative for behavioral problems.      Allergies  Review of patient's allergies indicates no known allergies.  Home Medications   Prior to Admission medications   Medication Sig Start Date End Date Taking? Authorizing Provider  amitriptyline (ELAVIL) 10 MG tablet Take 10-20 mg by mouth 2 (two) times daily. 1 in am, 2 in pm   Yes Historical Provider, MD  aspirin EC 81 MG tablet Take 81 mg by mouth daily.   Yes Historical Provider, MD  baclofen (LIORESAL) 10 MG tablet Take 10 mg by mouth 2 (two) times daily.    Yes Historical Provider, MD  carbamazepine (EPITOL) 200 MG tablet Take 200 mg by mouth 2 (two) times daily. 06/01/15  Yes Historical Provider, MD  diazepam (VALIUM) 2 MG tablet Take 2 mg by mouth every 6 (six) hours as needed for anxiety.   Yes Historical Provider, MD  fenofibrate 160 MG tablet Take 160 mg by mouth every evening.   Yes Historical Provider, MD  metFORMIN (GLUCOPHAGE) 1000 MG tablet Take 1,000 mg by mouth 2 (two) times daily with a meal.   Yes Historical Provider, MD  metoprolol (LOPRESSOR) 50 MG tablet Take 50 mg by mouth 2 (two) times daily.     Yes Historical Provider, MD  omeprazole (PRILOSEC) 20 MG capsule Take 20 mg by mouth daily.    Yes Historical Provider, MD  pioglitazone (ACTOS) 15 MG tablet Take 15 mg by mouth daily.   Yes Historical Provider, MD  simvastatin (ZOCOR) 80 MG tablet Take 80 mg by mouth every evening.   Yes Historical Provider, MD  tamsulosin (FLOMAX) 0.4 MG CAPS capsule Take 0.4 mg by mouth 2 (two) times daily.   Yes Historical Provider, MD  cefUROXime (CEFTIN) 250 MG tablet Take 1 tablet (250 mg total) by mouth 2 (two) times daily with a meal. Patient not taking: Reported on 07/21/2015 10/19/14   Orpah Greek, MD  ciprofloxacin (CIPRO) 500 MG  tablet Take 1 tablet (500 mg total) by mouth every 12 (twelve) hours. 07/21/15   Tanna Furry, MD  HYDROcodone-acetaminophen (NORCO/VICODIN) 5-325 MG per tablet Take 1 tablet by mouth every 4 (four) hours as needed. Patient not taking: Reported on 07/21/2015 09/28/13   Evalee Jefferson, PA-C  PRESCRIPTION MEDICATION Apply 1 application topically daily as needed. Face cream for rosacea.    Historical Provider, MD   BP 155/88 mmHg  Pulse 96  Temp(Src) 98.3 F (36.8 C) (Oral)  Resp 18  Ht 5\' 8"  (1.727 m)  Wt 200 lb (90.719 kg)  BMI 30.42 kg/m2  SpO2 99% Physical Exam  Constitutional: He is oriented to person, place, and time. He appears well-developed and well-nourished. No distress.  Patient is staff. Medication is via his girlfriend who uses an appon her phone with voice recognition. He communicates well with this.  HENT:  Head: Normocephalic.  Eyes: Conjunctivae are normal. Pupils are equal, round, and reactive to light. No scleral icterus.  Neck: Normal range of motion. Neck supple. No thyromegaly present.  Cardiovascular: Normal rate and regular rhythm.  Exam reveals no gallop and no friction rub.   No murmur heard. Pulmonary/Chest: Effort normal and breath sounds normal. No respiratory distress. He has no wheezes. He has no rales.  Abdominal: Soft. Bowel sounds are normal. He exhibits no distension. There is no tenderness. There is no rebound.  Musculoskeletal: Normal range of motion.  Neurological: He is alert and oriented to person, place, and time.  Skin: Skin is warm and dry. No rash noted.  Psychiatric: He has a normal mood and affect. His behavior is normal.    ED Course  Procedures (including critical care time) Labs Review Labs Reviewed  CBC WITH DIFFERENTIAL/PLATELET - Abnormal; Notable for the following:    Hemoglobin 12.8 (*)    All other components within normal limits  BASIC METABOLIC PANEL - Abnormal; Notable for the following:    Glucose, Bld 176 (*)    BUN 23 (*)     Calcium 10.4 (*)    All other components within normal limits  URINALYSIS, ROUTINE W REFLEX MICROSCOPIC (NOT AT [facility]) - Abnormal; Notable for the following:    Glucose, UA 100 (*)    Hgb urine dipstick TRACE (*)    Leukocytes, UA MODERATE (*)    All other components within normal limits  URINE MICROSCOPIC-ADD ON - Abnormal; Notable for the following:    Squamous Epithelial / LPF 0-5 (*)    Bacteria, UA FEW (*)    All other components within normal limits  CBG MONITORING, ED - Abnormal; Notable for the following:    Glucose-Capillary 139 (*)    All other components within normal limits  CBG MONITORING, ED    Imaging Review No results found. I have personally reviewed and evaluated these images and lab results as part of my medical decision-making.   EKG Interpretation   Date/Time:  Wednesday  Jul 21 2015 16:06:02 EDT Ventricular Rate:  103 PR Interval:  172 QRS Duration: 88 QT Interval:  336 QTC Calculation: 440 R Axis:   2 Text Interpretation:  Sinus tachycardia Possible Left atrial enlargement  Left ventricular hypertrophy Abnormal ECG No significant change since last  tracing Confirmed by ZACKOWSKI  MD, SCOTT [id number]) on 07/21/2015 4:15:04 PM      MDM   Final diagnoses:  UTI (lower urinary tract infection)    Given IM Rocephin. By mouth Cipro. Urine culture pending. Remainder of his evaluation here shows no acute findings. Calcium slightly high at 10.8. I would not expect this to cause confusion. S him to follow this up with her primary care physician. Discharge home with a prescription for Cipro. Pending urine culture.   Tanna Furry, MD 07/21/15 2036

## 2015-07-21 NOTE — ED Notes (Signed)
Patient given discharge instruction, verbalized understand. IV removed, band aid applied. Patient ambulatory out of the department.  

## 2015-07-27 DIAGNOSIS — L570 Actinic keratosis: Secondary | ICD-10-CM | POA: Diagnosis not present

## 2015-07-27 DIAGNOSIS — L219 Seborrheic dermatitis, unspecified: Secondary | ICD-10-CM | POA: Diagnosis not present

## 2015-07-27 DIAGNOSIS — Z85828 Personal history of other malignant neoplasm of skin: Secondary | ICD-10-CM | POA: Diagnosis not present

## 2015-10-05 DIAGNOSIS — E782 Mixed hyperlipidemia: Secondary | ICD-10-CM | POA: Diagnosis not present

## 2015-10-05 DIAGNOSIS — E663 Overweight: Secondary | ICD-10-CM | POA: Diagnosis not present

## 2015-10-05 DIAGNOSIS — Z6827 Body mass index (BMI) 27.0-27.9, adult: Secondary | ICD-10-CM | POA: Diagnosis not present

## 2015-10-05 DIAGNOSIS — N400 Enlarged prostate without lower urinary tract symptoms: Secondary | ICD-10-CM | POA: Diagnosis not present

## 2015-10-05 DIAGNOSIS — E1129 Type 2 diabetes mellitus with other diabetic kidney complication: Secondary | ICD-10-CM | POA: Diagnosis not present

## 2015-10-05 DIAGNOSIS — Z0000 Encounter for general adult medical examination without abnormal findings: Secondary | ICD-10-CM | POA: Diagnosis not present

## 2015-10-08 DIAGNOSIS — E1129 Type 2 diabetes mellitus with other diabetic kidney complication: Secondary | ICD-10-CM | POA: Diagnosis not present

## 2015-10-08 DIAGNOSIS — Z6827 Body mass index (BMI) 27.0-27.9, adult: Secondary | ICD-10-CM | POA: Diagnosis not present

## 2015-10-08 DIAGNOSIS — Z0000 Encounter for general adult medical examination without abnormal findings: Secondary | ICD-10-CM | POA: Diagnosis not present

## 2015-10-08 DIAGNOSIS — E663 Overweight: Secondary | ICD-10-CM | POA: Diagnosis not present

## 2015-11-02 DIAGNOSIS — L0100 Impetigo, unspecified: Secondary | ICD-10-CM | POA: Diagnosis not present

## 2015-11-02 DIAGNOSIS — L219 Seborrheic dermatitis, unspecified: Secondary | ICD-10-CM | POA: Diagnosis not present

## 2015-11-02 DIAGNOSIS — Z85828 Personal history of other malignant neoplasm of skin: Secondary | ICD-10-CM | POA: Diagnosis not present

## 2015-11-29 ENCOUNTER — Ambulatory Visit (INDEPENDENT_AMBULATORY_CARE_PROVIDER_SITE_OTHER): Payer: Commercial Managed Care - HMO | Admitting: Endocrinology

## 2015-11-29 ENCOUNTER — Encounter: Payer: Self-pay | Admitting: Endocrinology

## 2015-11-29 LAB — VITAMIN D 25 HYDROXY (VIT D DEFICIENCY, FRACTURES): VITD: 24.49 ng/mL — ABNORMAL LOW (ref 30.00–100.00)

## 2015-11-29 NOTE — Patient Instructions (Addendum)
blood tests are requested for you today.  We'll let you know about the results. If the parathyroid is high, you should consider parathyroid surgery, along with your other doctors.

## 2015-11-29 NOTE — Progress Notes (Signed)
Subjective:    Patient ID: Travis Phillips, male    DOB: 09-Aug-1955, 60 y.o.   MRN: [id number]  HPI Hx is from wife, due to pt's hearing loss.  pt was noted to have hypercalcemia in 2011. He has never had osteoporosis, thyroid probs, parathyroid probs, sarcoidosis, cancer, PUD, pancreatitis, recent severe injury, or bony fracture.  He does not take vitamin A supplements, but he takes avitamin-D supplement, 2000 units/day.  Pt has no h/o any of these: prolonged immobilization.  Pt denies taking antacids, Li++, or HCTZ.  He has moderate numbness of the feet, and assoc falls.  He has h/o urolithiasis.    Past Medical History:  Diagnosis Date  . Acid reflux   . Dizziness   . Headache(784.0)   . Hypercholesteremia   . Hypertension   . Spasticity   . Superficial siderosis of central nervous system 2012    Past Surgical History:  Procedure Laterality Date  . HERNIA REPAIR    . spinal tumor       Social History   Social History  . Marital status: Divorced    Spouse name: N/A  . Number of children: N/A  . Years of education: N/A   Occupational History  . Not on file.   Social History Main Topics  . Smoking status: Former Research scientist (life sciences)  . Smokeless tobacco: Not on file  . Alcohol use No  . Drug use: No  . Sexual activity: Not on file   Other Topics Concern  . Not on file   Social History Narrative  . No narrative on file    Current Outpatient Prescriptions on File Prior to Visit  Medication Sig Dispense Refill  . amitriptyline (ELAVIL) 10 MG tablet Take 10-20 mg by mouth 2 (two) times daily. 1 in am, 2 in pm    . aspirin EC 81 MG tablet Take 81 mg by mouth daily.    . baclofen (LIORESAL) 10 MG tablet Take 10 mg by mouth 2 (two) times daily.     . carbamazepine (EPITOL) 200 MG tablet Take 200 mg by mouth 2 (two) times daily.    . diazepam (VALIUM) 2 MG tablet Take 2 mg by mouth every 6 (six) hours as needed for anxiety.    . fenofibrate 160 MG tablet Take 160 mg by mouth every  evening.    . metoprolol (LOPRESSOR) 50 MG tablet Take 50 mg by mouth 2 (two) times daily.      Marland Kitchen omeprazole (PRILOSEC) 20 MG capsule Take 20 mg by mouth daily.     . pioglitazone (ACTOS) 15 MG tablet Take 15 mg by mouth daily.    Marland Kitchen PRESCRIPTION MEDICATION Apply 1 application topically daily as needed. Face cream for rosacea.    . simvastatin (ZOCOR) 80 MG tablet Take 80 mg by mouth every evening.    . tamsulosin (FLOMAX) 0.4 MG CAPS capsule Take 0.4 mg by mouth 2 (two) times daily.     No current facility-administered medications on file prior to visit.     No Known Allergies  Family History  Problem Relation Age of Onset  . Hypercalcemia Neg Hx     BP 120/74 (BP Location: Left Arm, Patient Position: Sitting)   Pulse 87   Wt 190 lb (86.2 kg)   SpO2 95%   BMI 28.89 kg/m    Review of Systems denies weight loss, gynecomastia, hematuria, numbness, arthralgias, abdominal pain, hypoglycemia, skin rash, visual loss, sob, diarrhea, easy bruising.  He has depression,  frequent urination, rhinorrhea, cold intolerance, and muscle spasms.      Objective:   Physical Exam VS: see vs page GEN: no distress HEAD: head: no deformity eyes: no periorbital swelling, no proptosis external nose and ears are normal mouth: no lesion seen NECK: supple, thyroid is not enlarged CHEST WALL: no deformity.  no kyphosis. LUNGS: clear to auscultation CV: reg rate and rhythm, no murmur ABD: abdomen is soft, nontender.  no hepatosplenomegaly.  not distended.  no hernia MUSCULOSKELETAL: muscle bulk and strength are grossly normal.  no obvious joint swelling.  gait is steady with a walker EXTEMITIES: no deformity.  no ulcer on the feet.  feet are cyanotic when held dependent, and cool to touch.  no edema.   PULSES: dorsalis pedis intact bilat.  no carotid bruit NEURO:  cn 2-12 grossly intact, except for hearing loss.   readily moves all 4's.  sensation is intact to touch on the feet SKIN:  Normal texture and  temperature.  No rash or suspicious lesion is visible.   NODES:  None palpable at the neck PSYCH: alert, well-oriented.  Does not appear anxious nor depressed.  I have reviewed outside records, and summarized: Pt was seen for AWV, and was noted to have persistent hypercalcemia.  He was feeling well in general.  He had HTN, but was not on HCTZ  outside test results are reviewed: Ca++=11.2 AP=44 Creat=1.6  Radiol: L-spine (2016): No acute osseous abnormality. 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  Facet degenerative changes on the right at L4-5 and L5-S1.  No mention is made of low bone density.       Assessment & Plan:  Hypercalcemia, new to me, uncertain etiology.  Urolithiasis: in this context, if he has primary hyperparathyroidism, this would be an indication for surgery, unless there is a medical contraindication.    Patient is advised the following: Patient Instructions  blood tests are requested for you today.  We'll let you know about the results. If the parathyroid is high, you should consider parathyroid surgery, along with your other doctors.

## 2015-11-30 LAB — PTH, INTACT AND CALCIUM
CALCIUM: 11.4 mg/dL — AB (ref 8.6–10.3)
PTH: 113 pg/mL — ABNORMAL HIGH (ref 14–64)

## 2015-12-01 LAB — PROTEIN ELECTROPHORESIS, SERUM
ALBUMIN ELP: 4.2 g/dL (ref 3.8–4.8)
ALPHA-2-GLOBULIN: 1 g/dL — AB (ref 0.5–0.9)
Alpha-1-Globulin: 0.4 g/dL — ABNORMAL HIGH (ref 0.2–0.3)
BETA 2: 0.4 g/dL (ref 0.2–0.5)
BETA GLOBULIN: 0.5 g/dL (ref 0.4–0.6)
Gamma Globulin: 0.5 g/dL — ABNORMAL LOW (ref 0.8–1.7)
Total Protein, Serum Electrophoresis: 7.2 g/dL (ref 6.1–8.1)

## 2015-12-01 LAB — VITAMIN D 1,25 DIHYDROXY
VITAMIN D3 1, 25 (OH): 68 pg/mL
Vitamin D 1, 25 (OH)2 Total: 68 pg/mL (ref 18–72)

## 2015-12-03 LAB — PTH-RELATED PEPTIDE: PTH-Related Protein (PTH-RP): 13 pg/mL — ABNORMAL LOW (ref 14–27)

## 2015-12-03 LAB — VITAMIN A: Vitamin A (Retinoic Acid): 87 ug/dL (ref 38–98)

## 2015-12-06 ENCOUNTER — Other Ambulatory Visit (HOSPITAL_COMMUNITY): Payer: Self-pay | Admitting: Nephrology

## 2015-12-06 DIAGNOSIS — N183 Chronic kidney disease, stage 3 unspecified: Secondary | ICD-10-CM

## 2015-12-22 ENCOUNTER — Ambulatory Visit (HOSPITAL_COMMUNITY)
Admission: RE | Admit: 2015-12-22 | Discharge: 2015-12-22 | Disposition: A | Discharge status: Home / Self Care | Payer: Commercial Managed Care - HMO | Source: Ambulatory Visit | Attending: Nephrology | Admitting: Nephrology

## 2015-12-22 DIAGNOSIS — N200 Calculus of kidney: Secondary | ICD-10-CM | POA: Diagnosis not present

## 2015-12-22 DIAGNOSIS — N183 Chronic kidney disease, stage 3 unspecified: Secondary | ICD-10-CM

## 2015-12-22 DIAGNOSIS — D509 Iron deficiency anemia, unspecified: Secondary | ICD-10-CM | POA: Diagnosis not present

## 2015-12-22 DIAGNOSIS — D519 Vitamin B12 deficiency anemia, unspecified: Secondary | ICD-10-CM | POA: Diagnosis not present

## 2015-12-22 DIAGNOSIS — Z79899 Other long term (current) drug therapy: Secondary | ICD-10-CM | POA: Diagnosis not present

## 2015-12-22 DIAGNOSIS — I10 Essential (primary) hypertension: Secondary | ICD-10-CM | POA: Diagnosis not present

## 2015-12-22 DIAGNOSIS — R809 Proteinuria, unspecified: Secondary | ICD-10-CM | POA: Diagnosis not present

## 2015-12-22 DIAGNOSIS — E559 Vitamin D deficiency, unspecified: Secondary | ICD-10-CM | POA: Diagnosis not present

## 2015-12-22 DIAGNOSIS — C9000 Multiple myeloma not having achieved remission: Secondary | ICD-10-CM | POA: Diagnosis not present

## 2015-12-23 ENCOUNTER — Telehealth: Payer: Self-pay | Admitting: Endocrinology

## 2015-12-23 DIAGNOSIS — E213 Hyperparathyroidism, unspecified: Secondary | ICD-10-CM | POA: Insufficient documentation

## 2015-12-23 NOTE — Telephone Encounter (Signed)
i've put it in. Please see a specialist.  you will receive a phone call, about a day and time for an appointment

## 2015-12-23 NOTE — Telephone Encounter (Signed)
I contacted the patient and advised of message via voicemail. Requested a call back if the patient would like to discuss.  

## 2015-12-23 NOTE — Telephone Encounter (Signed)
See message and please advise, Thanks!  

## 2015-12-23 NOTE — Telephone Encounter (Signed)
Patient wife is calling on the status of patient's surgery o his thyroid.  Please advise

## 2015-12-29 ENCOUNTER — Ambulatory Visit (HOSPITAL_COMMUNITY): Payer: Commercial Managed Care - HMO

## 2016-01-05 DIAGNOSIS — Z6827 Body mass index (BMI) 27.0-27.9, adult: Secondary | ICD-10-CM | POA: Diagnosis not present

## 2016-01-05 DIAGNOSIS — E210 Primary hyperparathyroidism: Secondary | ICD-10-CM | POA: Diagnosis not present

## 2016-01-05 DIAGNOSIS — E1142 Type 2 diabetes mellitus with diabetic polyneuropathy: Secondary | ICD-10-CM | POA: Diagnosis not present

## 2016-01-05 DIAGNOSIS — J634 Siderosis: Secondary | ICD-10-CM | POA: Diagnosis not present

## 2016-01-05 DIAGNOSIS — R252 Cramp and spasm: Secondary | ICD-10-CM | POA: Diagnosis not present

## 2016-01-12 ENCOUNTER — Ambulatory Visit (HOSPITAL_COMMUNITY): Payer: Commercial Managed Care - HMO | Attending: Internal Medicine | Admitting: Specialist

## 2016-01-12 DIAGNOSIS — R262 Difficulty in walking, not elsewhere classified: Secondary | ICD-10-CM | POA: Diagnosis not present

## 2016-01-12 NOTE — Therapy (Signed)
[facility] [address] [place], [place], [address] Phone: [phone]   Fax:  [phone]  Occupational Therapy Evaluation  Patient Details  Name: Travis Phillips MRN: [id number] Date of Birth: [birth year] No Data Recorded  Encounter Date: 01/12/2016      OT End of Session - 01/12/16 1526    Visit Number 1   Number of Visits 1   Authorization Type [employer]   OT Start Time [id number]   OT Stop Time 1430   OT Time Calculation (min) 45 min   Activity Tolerance Patient tolerated treatment well   Behavior During Therapy [facility] for tasks assessed/performed      Past Medical History:  Diagnosis Date  . Acid reflux   . Dizziness   . Headache(784.0)   . Hypercholesteremia   . Hypertension   . Spasticity   . Superficial siderosis of central nervous system 2012    Past Surgical History:  Procedure Laterality Date  . HERNIA REPAIR    . spinal tumor       There were no vitals filed for this visit.      Subjective Assessment - 01/12/16 1526    Currently in Pain? No/denies      01/12/2016  Travis Phillips is a 60 year old male who has been referred to occupational therapy for assessment for need of a power wheelchair.  Travis Phillips has a past medical history that includes spinal tumor, spasticity, hypertension, hypercholesteremia, dizziness, and superficial siderosis of the central nervous system, which was diagnosed in 2012.  This rare condition has been documented in less than 300 individuals worldwide.  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  This has caused him to have great difficulty walking.   Travis Phillips lives with his significant other and his son in a double wide trailer.    He has five steps to enter his home and is in the process of having a ramp installed to enter his home.  He is able to complete B/ADLS with minimal assistance.  He requires max assistance with I/ADLs such as cooking,  cleaning, home maintenance, and laundry.    Travis Phillips can ambulate with a walker with min-mod assist to the bathroom.  However, he often has little warning of his need to urinate and uses depends.  He falls frequently, sometimes several times in one day.  Obtaining a power wheelchair will allow Travis Phillips to continue to complete his ADLs safely.  A power wheelchair will also improve his safety in his home. His goal for obtaining a power wheelchair is to increase his safety and independence in her home.   A FULL PHYSICAL ASSESSMENT REVEALS THE FOLLOWING  Existing Equipment: Travis Phillips has a standard wheelchair, walker, bedside commode, shower chair.   Transfers: Travis Phillips transfers from sit to stand with moderate assistance.  Ambulation:  Travis Phillips can ambulate 5-10 feet with his walker with max assistance.   Balance:  WFL static sitting and standing balance, dynamic sitting and standing balance is challenged.   Head and Neck: WNL  Trunk: WFL Pelvis: WNL  Hip:  unable to range hips in seated position.  Knees: unable to range left knee.  Right knee has 25% range. Feet and Ankles: unable to range. Upper Extremities: Bilateral upper extremity AROM is WFL.  Strength is 3+/5 in right upper extremity and left upper extremity. Lower Extremities: unable to assess strength as he is unable to  actively mobilize lower extremities.   Weight Shifting Ability: Independent. Skin Integrity: history of pressure ulcers, although he does not have any ulcers at this time. Activity Tolerance:  poor. GOALS/OBJECTIVE OF SEATING INTERVENTION  Recommendations:  Travis Phillips has functional deficits in the areas of mobility and self-care, which are a result of the above listed diagnosis.  Specific functional limitations include: The patient is unable to walk safely with extensive assistance.  He is unable to functionally propel a lightweight or standard manual wheelchair for independent mobility and complete necessary B/ADLS/IADLs due to  decreased sustained activity tolerance.  Travis Phillips will use the power wheelchair on a daily basis as his primary means of mobility.  The recommended wheelchair meets current and future positioning needs, accessibility, durability, and safety requirements for functional use within patient's home environment. It is the least expensive power wheelchair that meets the patients current and future needs.  If you require any further information concerning Travis Phillips  positioning, independence or mobility needs; or any further information why a lesser device will not work, please do not hesitate to contact me at [address] [place],  [address] [phone].   ___________________ __________  Penelope Galas, OTR/L     Date                                   Plan - 01/12/16 1527    Clinical Impression Statement A:  Patient is a 60 year old male referred for a one time evaluation for a power wheelchair.  Patient has significant deficits causing him to be unable to ambuate or propel a standard wheelchair.     OT Frequency One time visit   OT Treatment/Interventions Self-care/ADL training;Patient/family education      Patient will benefit from skilled therapeutic intervention in order to improve the following deficits and impairments:  Abnormal gait  Visit Diagnosis: Difficulty in walking, not elsewhere classified - Plan: Ot plan of care cert/re-cert      G-Codes - [phone]    Functional Assessment Tool Used clinical observations   Functional Limitation Mobility: Walking and moving around   Mobility: Walking and Moving Around Current Status [phone]) At least 80 percent but less than 100 percent impaired, limited or restricted   Mobility: Walking and Moving Around Goal Status [phone]) At least 60 percent but less than 80 percent impaired, limited or restricted   Mobility: Walking and Moving Around  Discharge Status [phone]) At least 80 percent but less than 100 percent impaired, limited or restricted      Problem List Patient Active Problem List   Diagnosis Date Noted  . Hyperparathyroidism ([place]) 12/23/2015  . Hypercalcemia 11/29/2015  . HYPERLIPIDEMIA-MIXED 11/28/2008  . HYPERTENSION, UNSPECIFIED 11/28/2008  . CAD, NATIVE VESSEL 11/28/2008  . MURMUR 11/28/2008  . CONSTIPATION 11/26/2008  . HEMATOCHEZIA 09/08/2008  . WEIGHT LOSS, ABNORMAL 09/08/2008  . NAUSEA, CHRONIC 09/08/2008  . CHANGE IN BOWELS 09/08/2008  . ABDOMINAL PAIN OTHER SPECIFIED SITE 09/08/2008  . THYROID NODULE, HX OF 09/08/2008    Vangie Bicker, [place], OTR/L [phone]  01/12/2016, 3:31 PM  [facility] [place], [place], [address] Phone: [phone]   Fax:  [phone]  Name: HAVIER KELLAR MRN: [id number] Date of Birth: 07-Jan-1956

## 2016-01-16 DIAGNOSIS — R531 Weakness: Secondary | ICD-10-CM | POA: Diagnosis not present

## 2016-01-16 DIAGNOSIS — N390 Urinary tract infection, site not specified: Secondary | ICD-10-CM | POA: Diagnosis not present

## 2016-01-19 DIAGNOSIS — E119 Type 2 diabetes mellitus without complications: Secondary | ICD-10-CM | POA: Diagnosis not present

## 2016-01-19 DIAGNOSIS — R279 Unspecified lack of coordination: Secondary | ICD-10-CM | POA: Diagnosis not present

## 2016-01-19 DIAGNOSIS — M6281 Muscle weakness (generalized): Secondary | ICD-10-CM | POA: Diagnosis not present

## 2016-01-19 DIAGNOSIS — E785 Hyperlipidemia, unspecified: Secondary | ICD-10-CM | POA: Diagnosis not present

## 2016-01-19 DIAGNOSIS — G20 Parkinson's disease: Secondary | ICD-10-CM | POA: Diagnosis not present

## 2016-01-19 DIAGNOSIS — N10 Acute tubulo-interstitial nephritis: Secondary | ICD-10-CM | POA: Diagnosis not present

## 2016-01-19 DIAGNOSIS — E860 Dehydration: Secondary | ICD-10-CM | POA: Diagnosis not present

## 2016-01-19 DIAGNOSIS — R262 Difficulty in walking, not elsewhere classified: Secondary | ICD-10-CM | POA: Diagnosis not present

## 2016-01-19 DIAGNOSIS — Z743 Need for continuous supervision: Secondary | ICD-10-CM | POA: Diagnosis not present

## 2016-01-19 DIAGNOSIS — A419 Sepsis, unspecified organism: Secondary | ICD-10-CM | POA: Diagnosis not present

## 2016-01-19 DIAGNOSIS — R41841 Cognitive communication deficit: Secondary | ICD-10-CM | POA: Diagnosis not present

## 2016-01-19 DIAGNOSIS — F0280 Dementia in other diseases classified elsewhere without behavioral disturbance: Secondary | ICD-10-CM | POA: Diagnosis not present

## 2016-01-19 DIAGNOSIS — N201 Calculus of ureter: Secondary | ICD-10-CM | POA: Diagnosis not present

## 2016-01-19 DIAGNOSIS — N132 Hydronephrosis with renal and ureteral calculous obstruction: Secondary | ICD-10-CM | POA: Diagnosis not present

## 2016-01-19 DIAGNOSIS — E1165 Type 2 diabetes mellitus with hyperglycemia: Secondary | ICD-10-CM | POA: Diagnosis not present

## 2016-01-19 DIAGNOSIS — J634 Siderosis: Secondary | ICD-10-CM | POA: Diagnosis not present

## 2016-01-19 DIAGNOSIS — R1311 Dysphagia, oral phase: Secondary | ICD-10-CM | POA: Diagnosis not present

## 2016-01-19 DIAGNOSIS — R531 Weakness: Secondary | ICD-10-CM | POA: Diagnosis not present

## 2016-01-19 DIAGNOSIS — I10 Essential (primary) hypertension: Secondary | ICD-10-CM | POA: Diagnosis not present

## 2016-01-19 DIAGNOSIS — N390 Urinary tract infection, site not specified: Secondary | ICD-10-CM | POA: Diagnosis not present

## 2016-01-19 DIAGNOSIS — N401 Enlarged prostate with lower urinary tract symptoms: Secondary | ICD-10-CM | POA: Diagnosis not present

## 2016-01-19 DIAGNOSIS — N136 Pyonephrosis: Secondary | ICD-10-CM | POA: Diagnosis not present

## 2016-01-20 DIAGNOSIS — N201 Calculus of ureter: Secondary | ICD-10-CM | POA: Diagnosis not present

## 2016-01-20 DIAGNOSIS — N10 Acute tubulo-interstitial nephritis: Secondary | ICD-10-CM | POA: Diagnosis not present

## 2016-01-21 DIAGNOSIS — N10 Acute tubulo-interstitial nephritis: Secondary | ICD-10-CM | POA: Diagnosis not present

## 2016-01-22 DIAGNOSIS — N10 Acute tubulo-interstitial nephritis: Secondary | ICD-10-CM | POA: Diagnosis not present

## 2016-01-23 DIAGNOSIS — N10 Acute tubulo-interstitial nephritis: Secondary | ICD-10-CM | POA: Diagnosis not present

## 2016-01-24 DIAGNOSIS — N10 Acute tubulo-interstitial nephritis: Secondary | ICD-10-CM | POA: Diagnosis not present

## 2016-01-25 DIAGNOSIS — N10 Acute tubulo-interstitial nephritis: Secondary | ICD-10-CM | POA: Diagnosis not present

## 2016-01-26 DIAGNOSIS — A419 Sepsis, unspecified organism: Secondary | ICD-10-CM | POA: Diagnosis not present

## 2016-01-26 DIAGNOSIS — E860 Dehydration: Secondary | ICD-10-CM | POA: Diagnosis not present

## 2016-01-26 DIAGNOSIS — N10 Acute tubulo-interstitial nephritis: Secondary | ICD-10-CM | POA: Diagnosis not present

## 2016-01-26 DIAGNOSIS — F0280 Dementia in other diseases classified elsewhere without behavioral disturbance: Secondary | ICD-10-CM | POA: Diagnosis not present

## 2016-01-26 DIAGNOSIS — F4489 Other dissociative and conversion disorders: Secondary | ICD-10-CM | POA: Diagnosis not present

## 2016-01-26 DIAGNOSIS — I639 Cerebral infarction, unspecified: Secondary | ICD-10-CM | POA: Diagnosis not present

## 2016-01-26 DIAGNOSIS — J634 Siderosis: Secondary | ICD-10-CM | POA: Diagnosis not present

## 2016-01-26 DIAGNOSIS — E119 Type 2 diabetes mellitus without complications: Secondary | ICD-10-CM | POA: Diagnosis not present

## 2016-01-26 DIAGNOSIS — E785 Hyperlipidemia, unspecified: Secondary | ICD-10-CM | POA: Diagnosis not present

## 2016-01-26 DIAGNOSIS — N401 Enlarged prostate with lower urinary tract symptoms: Secondary | ICD-10-CM | POA: Diagnosis not present

## 2016-01-26 DIAGNOSIS — N136 Pyonephrosis: Secondary | ICD-10-CM | POA: Diagnosis not present

## 2016-01-26 DIAGNOSIS — R531 Weakness: Secondary | ICD-10-CM | POA: Diagnosis not present

## 2016-01-26 DIAGNOSIS — M6281 Muscle weakness (generalized): Secondary | ICD-10-CM | POA: Diagnosis not present

## 2016-01-26 DIAGNOSIS — R279 Unspecified lack of coordination: Secondary | ICD-10-CM | POA: Diagnosis not present

## 2016-01-26 DIAGNOSIS — N390 Urinary tract infection, site not specified: Secondary | ICD-10-CM | POA: Diagnosis not present

## 2016-01-26 DIAGNOSIS — N12 Tubulo-interstitial nephritis, not specified as acute or chronic: Secondary | ICD-10-CM | POA: Diagnosis not present

## 2016-01-26 DIAGNOSIS — R4182 Altered mental status, unspecified: Secondary | ICD-10-CM | POA: Diagnosis not present

## 2016-01-26 DIAGNOSIS — E1165 Type 2 diabetes mellitus with hyperglycemia: Secondary | ICD-10-CM | POA: Diagnosis not present

## 2016-01-26 DIAGNOSIS — I10 Essential (primary) hypertension: Secondary | ICD-10-CM | POA: Diagnosis not present

## 2016-01-26 DIAGNOSIS — Z743 Need for continuous supervision: Secondary | ICD-10-CM | POA: Diagnosis not present

## 2016-01-26 DIAGNOSIS — R0989 Other specified symptoms and signs involving the circulatory and respiratory systems: Secondary | ICD-10-CM | POA: Diagnosis not present

## 2016-01-26 DIAGNOSIS — R1311 Dysphagia, oral phase: Secondary | ICD-10-CM | POA: Diagnosis not present

## 2016-01-26 DIAGNOSIS — R41841 Cognitive communication deficit: Secondary | ICD-10-CM | POA: Diagnosis not present

## 2016-01-26 DIAGNOSIS — R262 Difficulty in walking, not elsewhere classified: Secondary | ICD-10-CM | POA: Diagnosis not present

## 2016-01-26 DIAGNOSIS — I6789 Other cerebrovascular disease: Secondary | ICD-10-CM | POA: Diagnosis not present

## 2016-01-26 DIAGNOSIS — G20 Parkinson's disease: Secondary | ICD-10-CM | POA: Diagnosis not present

## 2016-01-26 DIAGNOSIS — Z888 Allergy status to other drugs, medicaments and biological substances status: Secondary | ICD-10-CM | POA: Diagnosis not present

## 2016-01-31 DIAGNOSIS — R4182 Altered mental status, unspecified: Secondary | ICD-10-CM | POA: Diagnosis not present

## 2016-01-31 DIAGNOSIS — R0989 Other specified symptoms and signs involving the circulatory and respiratory systems: Secondary | ICD-10-CM | POA: Diagnosis not present

## 2016-01-31 DIAGNOSIS — N390 Urinary tract infection, site not specified: Secondary | ICD-10-CM | POA: Diagnosis not present

## 2016-01-31 DIAGNOSIS — I639 Cerebral infarction, unspecified: Secondary | ICD-10-CM | POA: Diagnosis not present

## 2016-01-31 DIAGNOSIS — R531 Weakness: Secondary | ICD-10-CM | POA: Diagnosis not present

## 2016-02-01 DIAGNOSIS — N12 Tubulo-interstitial nephritis, not specified as acute or chronic: Secondary | ICD-10-CM | POA: Diagnosis not present

## 2016-02-01 DIAGNOSIS — E119 Type 2 diabetes mellitus without complications: Secondary | ICD-10-CM | POA: Diagnosis not present

## 2016-02-06 DIAGNOSIS — R4182 Altered mental status, unspecified: Secondary | ICD-10-CM | POA: Diagnosis not present

## 2016-02-06 DIAGNOSIS — I639 Cerebral infarction, unspecified: Secondary | ICD-10-CM | POA: Diagnosis not present

## 2016-02-06 DIAGNOSIS — Z888 Allergy status to other drugs, medicaments and biological substances status: Secondary | ICD-10-CM | POA: Diagnosis not present

## 2016-02-06 DIAGNOSIS — I10 Essential (primary) hypertension: Secondary | ICD-10-CM | POA: Diagnosis not present

## 2016-02-06 DIAGNOSIS — E119 Type 2 diabetes mellitus without complications: Secondary | ICD-10-CM | POA: Diagnosis not present

## 2016-02-07 DIAGNOSIS — R4182 Altered mental status, unspecified: Secondary | ICD-10-CM | POA: Diagnosis not present

## 2016-02-07 DIAGNOSIS — H9190 Unspecified hearing loss, unspecified ear: Secondary | ICD-10-CM | POA: Diagnosis not present

## 2016-02-07 DIAGNOSIS — R2689 Other abnormalities of gait and mobility: Secondary | ICD-10-CM | POA: Diagnosis not present

## 2016-02-07 DIAGNOSIS — H44329 Siderosis of eye, unspecified eye: Secondary | ICD-10-CM | POA: Diagnosis not present

## 2016-02-07 DIAGNOSIS — Z888 Allergy status to other drugs, medicaments and biological substances status: Secondary | ICD-10-CM | POA: Diagnosis not present

## 2016-02-07 DIAGNOSIS — E119 Type 2 diabetes mellitus without complications: Secondary | ICD-10-CM | POA: Diagnosis not present

## 2016-02-07 DIAGNOSIS — N1330 Unspecified hydronephrosis: Secondary | ICD-10-CM | POA: Diagnosis not present

## 2016-02-07 DIAGNOSIS — J634 Siderosis: Secondary | ICD-10-CM | POA: Diagnosis not present

## 2016-02-07 DIAGNOSIS — G4089 Other seizures: Secondary | ICD-10-CM | POA: Diagnosis not present

## 2016-02-07 DIAGNOSIS — F05 Delirium due to known physiological condition: Secondary | ICD-10-CM | POA: Diagnosis not present

## 2016-02-07 DIAGNOSIS — E213 Hyperparathyroidism, unspecified: Secondary | ICD-10-CM | POA: Diagnosis not present

## 2016-02-07 DIAGNOSIS — I10 Essential (primary) hypertension: Secondary | ICD-10-CM | POA: Diagnosis not present

## 2016-02-07 DIAGNOSIS — G9341 Metabolic encephalopathy: Secondary | ICD-10-CM | POA: Diagnosis not present

## 2016-02-07 DIAGNOSIS — N390 Urinary tract infection, site not specified: Secondary | ICD-10-CM | POA: Diagnosis not present

## 2016-02-07 DIAGNOSIS — E870 Hyperosmolality and hypernatremia: Secondary | ICD-10-CM | POA: Diagnosis not present

## 2016-02-18 ENCOUNTER — Telehealth: Payer: Self-pay | Admitting: Endocrinology

## 2016-02-18 DIAGNOSIS — E213 Hyperparathyroidism, unspecified: Secondary | ICD-10-CM

## 2016-02-18 NOTE — Telephone Encounter (Signed)
[facility] called stated Patient need a referral for Hyperparathyroidism, in order to be seen.

## 2016-02-20 DIAGNOSIS — N390 Urinary tract infection, site not specified: Secondary | ICD-10-CM | POA: Diagnosis not present

## 2016-02-20 DIAGNOSIS — L89152 Pressure ulcer of sacral region, stage 2: Secondary | ICD-10-CM | POA: Diagnosis not present

## 2016-02-20 DIAGNOSIS — E119 Type 2 diabetes mellitus without complications: Secondary | ICD-10-CM | POA: Diagnosis not present

## 2016-02-20 DIAGNOSIS — M6281 Muscle weakness (generalized): Secondary | ICD-10-CM | POA: Diagnosis not present

## 2016-02-20 DIAGNOSIS — J634 Siderosis: Secondary | ICD-10-CM | POA: Diagnosis not present

## 2016-02-22 DIAGNOSIS — J634 Siderosis: Secondary | ICD-10-CM | POA: Diagnosis not present

## 2016-02-22 DIAGNOSIS — M6281 Muscle weakness (generalized): Secondary | ICD-10-CM | POA: Diagnosis not present

## 2016-02-22 DIAGNOSIS — E210 Primary hyperparathyroidism: Secondary | ICD-10-CM | POA: Diagnosis not present

## 2016-02-22 DIAGNOSIS — E119 Type 2 diabetes mellitus without complications: Secondary | ICD-10-CM | POA: Diagnosis not present

## 2016-02-22 DIAGNOSIS — N390 Urinary tract infection, site not specified: Secondary | ICD-10-CM | POA: Diagnosis not present

## 2016-02-22 DIAGNOSIS — L89152 Pressure ulcer of sacral region, stage 2: Secondary | ICD-10-CM | POA: Diagnosis not present

## 2016-02-22 NOTE — Telephone Encounter (Signed)
See message and please advise, Thanks!  

## 2016-02-22 NOTE — Telephone Encounter (Signed)
This was done on 12/23/15.  I have redone the referral.

## 2016-02-23 DIAGNOSIS — A419 Sepsis, unspecified organism: Secondary | ICD-10-CM | POA: Diagnosis not present

## 2016-02-23 DIAGNOSIS — E663 Overweight: Secondary | ICD-10-CM | POA: Diagnosis not present

## 2016-02-23 DIAGNOSIS — Z1389 Encounter for screening for other disorder: Secondary | ICD-10-CM | POA: Diagnosis not present

## 2016-02-23 DIAGNOSIS — E210 Primary hyperparathyroidism: Secondary | ICD-10-CM | POA: Diagnosis not present

## 2016-02-23 DIAGNOSIS — E1165 Type 2 diabetes mellitus with hyperglycemia: Secondary | ICD-10-CM | POA: Diagnosis not present

## 2016-02-23 DIAGNOSIS — N390 Urinary tract infection, site not specified: Secondary | ICD-10-CM | POA: Diagnosis not present

## 2016-02-23 DIAGNOSIS — Z6825 Body mass index (BMI) 25.0-25.9, adult: Secondary | ICD-10-CM | POA: Diagnosis not present

## 2016-02-24 ENCOUNTER — Other Ambulatory Visit (HOSPITAL_COMMUNITY): Payer: Self-pay | Admitting: Surgery

## 2016-02-24 DIAGNOSIS — L89152 Pressure ulcer of sacral region, stage 2: Secondary | ICD-10-CM | POA: Diagnosis not present

## 2016-02-24 DIAGNOSIS — M6281 Muscle weakness (generalized): Secondary | ICD-10-CM | POA: Diagnosis not present

## 2016-02-24 DIAGNOSIS — E210 Primary hyperparathyroidism: Secondary | ICD-10-CM

## 2016-02-24 DIAGNOSIS — N390 Urinary tract infection, site not specified: Secondary | ICD-10-CM | POA: Diagnosis not present

## 2016-02-24 DIAGNOSIS — J634 Siderosis: Secondary | ICD-10-CM | POA: Diagnosis not present

## 2016-02-24 DIAGNOSIS — E119 Type 2 diabetes mellitus without complications: Secondary | ICD-10-CM | POA: Diagnosis not present

## 2016-02-25 DIAGNOSIS — M6281 Muscle weakness (generalized): Secondary | ICD-10-CM | POA: Diagnosis not present

## 2016-02-25 DIAGNOSIS — J634 Siderosis: Secondary | ICD-10-CM | POA: Diagnosis not present

## 2016-02-25 DIAGNOSIS — E119 Type 2 diabetes mellitus without complications: Secondary | ICD-10-CM | POA: Diagnosis not present

## 2016-02-25 DIAGNOSIS — N390 Urinary tract infection, site not specified: Secondary | ICD-10-CM | POA: Diagnosis not present

## 2016-02-25 DIAGNOSIS — L89152 Pressure ulcer of sacral region, stage 2: Secondary | ICD-10-CM | POA: Diagnosis not present

## 2016-02-28 ENCOUNTER — Encounter (HOSPITAL_COMMUNITY): Payer: Medicare HMO

## 2016-03-03 DIAGNOSIS — E119 Type 2 diabetes mellitus without complications: Secondary | ICD-10-CM | POA: Diagnosis not present

## 2016-03-03 DIAGNOSIS — L89152 Pressure ulcer of sacral region, stage 2: Secondary | ICD-10-CM | POA: Diagnosis not present

## 2016-03-03 DIAGNOSIS — J634 Siderosis: Secondary | ICD-10-CM | POA: Diagnosis not present

## 2016-03-03 DIAGNOSIS — M6281 Muscle weakness (generalized): Secondary | ICD-10-CM | POA: Diagnosis not present

## 2016-03-03 DIAGNOSIS — N390 Urinary tract infection, site not specified: Secondary | ICD-10-CM | POA: Diagnosis not present

## 2016-03-06 ENCOUNTER — Encounter (HOSPITAL_COMMUNITY)
Admission: RE | Admit: 2016-03-06 | Discharge: 2016-03-06 | Disposition: A | Discharge status: Home / Self Care | Payer: Medicare HMO | Source: Ambulatory Visit | Attending: Surgery | Admitting: Surgery

## 2016-03-06 ENCOUNTER — Encounter (HOSPITAL_COMMUNITY): Payer: Self-pay

## 2016-03-06 DIAGNOSIS — R338 Other retention of urine: Secondary | ICD-10-CM | POA: Diagnosis not present

## 2016-03-06 DIAGNOSIS — R948 Abnormal results of function studies of other organs and systems: Secondary | ICD-10-CM | POA: Diagnosis not present

## 2016-03-06 DIAGNOSIS — E213 Hyperparathyroidism, unspecified: Secondary | ICD-10-CM | POA: Diagnosis not present

## 2016-03-06 DIAGNOSIS — N12 Tubulo-interstitial nephritis, not specified as acute or chronic: Secondary | ICD-10-CM | POA: Diagnosis not present

## 2016-03-06 DIAGNOSIS — E119 Type 2 diabetes mellitus without complications: Secondary | ICD-10-CM | POA: Diagnosis not present

## 2016-03-06 DIAGNOSIS — N390 Urinary tract infection, site not specified: Secondary | ICD-10-CM | POA: Diagnosis not present

## 2016-03-06 DIAGNOSIS — A419 Sepsis, unspecified organism: Secondary | ICD-10-CM | POA: Diagnosis not present

## 2016-03-06 DIAGNOSIS — E210 Primary hyperparathyroidism: Secondary | ICD-10-CM | POA: Diagnosis not present

## 2016-03-06 DIAGNOSIS — L89159 Pressure ulcer of sacral region, unspecified stage: Secondary | ICD-10-CM | POA: Diagnosis not present

## 2016-03-06 DIAGNOSIS — G9341 Metabolic encephalopathy: Secondary | ICD-10-CM | POA: Diagnosis not present

## 2016-03-06 DIAGNOSIS — N179 Acute kidney failure, unspecified: Secondary | ICD-10-CM | POA: Diagnosis not present

## 2016-03-06 DIAGNOSIS — N401 Enlarged prostate with lower urinary tract symptoms: Secondary | ICD-10-CM | POA: Diagnosis not present

## 2016-03-06 HISTORY — DX: Malignant (primary) neoplasm, unspecified: C80.1

## 2016-03-06 HISTORY — DX: Type 2 diabetes mellitus without complications: E11.9

## 2016-03-06 MED ORDER — TECHNETIUM TC 99M SESTAMIBI - CARDIOLITE
25.0000 | Freq: Once | INTRAVENOUS | Status: AC | PRN
  Administered 2016-03-06: 10:00:00 23.5 via INTRAVENOUS

## 2016-03-10 DIAGNOSIS — E119 Type 2 diabetes mellitus without complications: Secondary | ICD-10-CM | POA: Diagnosis not present

## 2016-03-10 DIAGNOSIS — A419 Sepsis, unspecified organism: Secondary | ICD-10-CM | POA: Diagnosis not present

## 2016-03-10 DIAGNOSIS — J634 Siderosis: Secondary | ICD-10-CM | POA: Diagnosis not present

## 2016-03-10 DIAGNOSIS — L89152 Pressure ulcer of sacral region, stage 2: Secondary | ICD-10-CM | POA: Diagnosis not present

## 2016-03-10 DIAGNOSIS — N390 Urinary tract infection, site not specified: Secondary | ICD-10-CM | POA: Diagnosis not present

## 2016-03-10 DIAGNOSIS — G9341 Metabolic encephalopathy: Secondary | ICD-10-CM | POA: Diagnosis not present

## 2016-03-11 DIAGNOSIS — G9341 Metabolic encephalopathy: Secondary | ICD-10-CM | POA: Diagnosis not present

## 2016-03-11 DIAGNOSIS — N390 Urinary tract infection, site not specified: Secondary | ICD-10-CM | POA: Diagnosis not present

## 2016-03-11 DIAGNOSIS — L89152 Pressure ulcer of sacral region, stage 2: Secondary | ICD-10-CM | POA: Diagnosis not present

## 2016-03-11 DIAGNOSIS — E119 Type 2 diabetes mellitus without complications: Secondary | ICD-10-CM | POA: Diagnosis not present

## 2016-03-11 DIAGNOSIS — A419 Sepsis, unspecified organism: Secondary | ICD-10-CM | POA: Diagnosis not present

## 2016-03-11 DIAGNOSIS — J634 Siderosis: Secondary | ICD-10-CM | POA: Diagnosis not present

## 2016-03-12 DIAGNOSIS — E119 Type 2 diabetes mellitus without complications: Secondary | ICD-10-CM | POA: Diagnosis not present

## 2016-03-12 DIAGNOSIS — A419 Sepsis, unspecified organism: Secondary | ICD-10-CM | POA: Diagnosis not present

## 2016-03-12 DIAGNOSIS — G9341 Metabolic encephalopathy: Secondary | ICD-10-CM | POA: Diagnosis not present

## 2016-03-12 DIAGNOSIS — L89152 Pressure ulcer of sacral region, stage 2: Secondary | ICD-10-CM | POA: Diagnosis not present

## 2016-03-12 DIAGNOSIS — N390 Urinary tract infection, site not specified: Secondary | ICD-10-CM | POA: Diagnosis not present

## 2016-03-12 DIAGNOSIS — J634 Siderosis: Secondary | ICD-10-CM | POA: Diagnosis not present

## 2016-03-14 ENCOUNTER — Ambulatory Visit: Payer: Self-pay | Admitting: Surgery

## 2016-03-16 DIAGNOSIS — J634 Siderosis: Secondary | ICD-10-CM | POA: Diagnosis not present

## 2016-03-16 DIAGNOSIS — E1129 Type 2 diabetes mellitus with other diabetic kidney complication: Secondary | ICD-10-CM | POA: Diagnosis not present

## 2016-03-16 DIAGNOSIS — L8990 Pressure ulcer of unspecified site, unspecified stage: Secondary | ICD-10-CM | POA: Diagnosis not present

## 2016-03-16 DIAGNOSIS — N401 Enlarged prostate with lower urinary tract symptoms: Secondary | ICD-10-CM | POA: Diagnosis not present

## 2016-03-16 DIAGNOSIS — E210 Primary hyperparathyroidism: Secondary | ICD-10-CM | POA: Diagnosis not present

## 2016-03-16 DIAGNOSIS — L0390 Cellulitis, unspecified: Secondary | ICD-10-CM | POA: Diagnosis not present

## 2016-03-16 DIAGNOSIS — N139 Obstructive and reflux uropathy, unspecified: Secondary | ICD-10-CM | POA: Diagnosis not present

## 2016-03-16 DIAGNOSIS — Z681 Body mass index (BMI) 19 or less, adult: Secondary | ICD-10-CM | POA: Diagnosis not present

## 2016-03-16 DIAGNOSIS — R4182 Altered mental status, unspecified: Secondary | ICD-10-CM | POA: Diagnosis not present

## 2016-03-20 DIAGNOSIS — J634 Siderosis: Secondary | ICD-10-CM | POA: Diagnosis not present

## 2016-03-20 DIAGNOSIS — M1990 Unspecified osteoarthritis, unspecified site: Secondary | ICD-10-CM | POA: Diagnosis not present

## 2016-03-20 DIAGNOSIS — Z79899 Other long term (current) drug therapy: Secondary | ICD-10-CM | POA: Diagnosis not present

## 2016-03-20 DIAGNOSIS — L89153 Pressure ulcer of sacral region, stage 3: Secondary | ICD-10-CM | POA: Diagnosis not present

## 2016-03-20 DIAGNOSIS — Z87440 Personal history of urinary (tract) infections: Secondary | ICD-10-CM | POA: Diagnosis not present

## 2016-03-20 DIAGNOSIS — H918X3 Other specified hearing loss, bilateral: Secondary | ICD-10-CM | POA: Diagnosis not present

## 2016-03-20 DIAGNOSIS — I10 Essential (primary) hypertension: Secondary | ICD-10-CM | POA: Diagnosis not present

## 2016-03-20 DIAGNOSIS — Z7982 Long term (current) use of aspirin: Secondary | ICD-10-CM | POA: Diagnosis not present

## 2016-03-20 DIAGNOSIS — Z87442 Personal history of urinary calculi: Secondary | ICD-10-CM | POA: Diagnosis not present

## 2016-03-21 DIAGNOSIS — L89153 Pressure ulcer of sacral region, stage 3: Secondary | ICD-10-CM | POA: Diagnosis not present

## 2016-03-21 DIAGNOSIS — G9341 Metabolic encephalopathy: Secondary | ICD-10-CM | POA: Diagnosis not present

## 2016-03-21 DIAGNOSIS — G3184 Mild cognitive impairment, so stated: Secondary | ICD-10-CM | POA: Diagnosis not present

## 2016-03-21 DIAGNOSIS — D643 Other sideroblastic anemias: Secondary | ICD-10-CM | POA: Diagnosis not present

## 2016-03-21 DIAGNOSIS — J634 Siderosis: Secondary | ICD-10-CM | POA: Diagnosis not present

## 2016-03-21 DIAGNOSIS — L89152 Pressure ulcer of sacral region, stage 2: Secondary | ICD-10-CM | POA: Diagnosis not present

## 2016-03-21 DIAGNOSIS — N390 Urinary tract infection, site not specified: Secondary | ICD-10-CM | POA: Diagnosis not present

## 2016-03-21 DIAGNOSIS — E11622 Type 2 diabetes mellitus with other skin ulcer: Secondary | ICD-10-CM | POA: Diagnosis not present

## 2016-03-21 DIAGNOSIS — E119 Type 2 diabetes mellitus without complications: Secondary | ICD-10-CM | POA: Diagnosis not present

## 2016-03-21 DIAGNOSIS — A419 Sepsis, unspecified organism: Secondary | ICD-10-CM | POA: Diagnosis not present

## 2016-03-22 DIAGNOSIS — J634 Siderosis: Secondary | ICD-10-CM | POA: Diagnosis not present

## 2016-03-22 DIAGNOSIS — A419 Sepsis, unspecified organism: Secondary | ICD-10-CM | POA: Diagnosis not present

## 2016-03-22 DIAGNOSIS — I10 Essential (primary) hypertension: Secondary | ICD-10-CM | POA: Diagnosis not present

## 2016-03-22 DIAGNOSIS — G9341 Metabolic encephalopathy: Secondary | ICD-10-CM | POA: Diagnosis not present

## 2016-03-22 DIAGNOSIS — E119 Type 2 diabetes mellitus without complications: Secondary | ICD-10-CM | POA: Diagnosis not present

## 2016-03-22 DIAGNOSIS — N390 Urinary tract infection, site not specified: Secondary | ICD-10-CM | POA: Diagnosis not present

## 2016-03-22 DIAGNOSIS — L89152 Pressure ulcer of sacral region, stage 2: Secondary | ICD-10-CM | POA: Diagnosis not present

## 2016-03-23 DIAGNOSIS — T191XXD Foreign body in bladder, subsequent encounter: Secondary | ICD-10-CM | POA: Diagnosis not present

## 2016-03-24 DIAGNOSIS — L89152 Pressure ulcer of sacral region, stage 2: Secondary | ICD-10-CM | POA: Diagnosis not present

## 2016-03-24 DIAGNOSIS — E119 Type 2 diabetes mellitus without complications: Secondary | ICD-10-CM | POA: Diagnosis not present

## 2016-03-24 DIAGNOSIS — N390 Urinary tract infection, site not specified: Secondary | ICD-10-CM | POA: Diagnosis not present

## 2016-03-24 DIAGNOSIS — G9341 Metabolic encephalopathy: Secondary | ICD-10-CM | POA: Diagnosis not present

## 2016-03-24 DIAGNOSIS — A419 Sepsis, unspecified organism: Secondary | ICD-10-CM | POA: Diagnosis not present

## 2016-03-24 DIAGNOSIS — J634 Siderosis: Secondary | ICD-10-CM | POA: Diagnosis not present

## 2016-03-27 DIAGNOSIS — I10 Essential (primary) hypertension: Secondary | ICD-10-CM | POA: Diagnosis not present

## 2016-03-27 DIAGNOSIS — E538 Deficiency of other specified B group vitamins: Secondary | ICD-10-CM | POA: Diagnosis not present

## 2016-03-27 DIAGNOSIS — A419 Sepsis, unspecified organism: Secondary | ICD-10-CM | POA: Diagnosis not present

## 2016-03-27 DIAGNOSIS — J634 Siderosis: Secondary | ICD-10-CM | POA: Diagnosis not present

## 2016-03-27 DIAGNOSIS — N390 Urinary tract infection, site not specified: Secondary | ICD-10-CM | POA: Diagnosis not present

## 2016-03-27 DIAGNOSIS — Z87891 Personal history of nicotine dependence: Secondary | ICD-10-CM | POA: Diagnosis not present

## 2016-03-27 DIAGNOSIS — M868X8 Other osteomyelitis, other site: Secondary | ICD-10-CM | POA: Diagnosis not present

## 2016-03-27 DIAGNOSIS — Z79899 Other long term (current) drug therapy: Secondary | ICD-10-CM | POA: Diagnosis not present

## 2016-03-27 DIAGNOSIS — Z7982 Long term (current) use of aspirin: Secondary | ICD-10-CM | POA: Diagnosis not present

## 2016-03-27 DIAGNOSIS — M1990 Unspecified osteoarthritis, unspecified site: Secondary | ICD-10-CM | POA: Diagnosis not present

## 2016-03-27 DIAGNOSIS — D519 Vitamin B12 deficiency anemia, unspecified: Secondary | ICD-10-CM | POA: Diagnosis not present

## 2016-03-27 DIAGNOSIS — G9341 Metabolic encephalopathy: Secondary | ICD-10-CM | POA: Diagnosis not present

## 2016-03-27 DIAGNOSIS — L89152 Pressure ulcer of sacral region, stage 2: Secondary | ICD-10-CM | POA: Diagnosis not present

## 2016-03-27 DIAGNOSIS — E119 Type 2 diabetes mellitus without complications: Secondary | ICD-10-CM | POA: Diagnosis not present

## 2016-03-27 DIAGNOSIS — E559 Vitamin D deficiency, unspecified: Secondary | ICD-10-CM | POA: Diagnosis not present

## 2016-03-27 DIAGNOSIS — L89153 Pressure ulcer of sacral region, stage 3: Secondary | ICD-10-CM | POA: Diagnosis not present

## 2016-03-30 DIAGNOSIS — J634 Siderosis: Secondary | ICD-10-CM | POA: Diagnosis not present

## 2016-03-30 DIAGNOSIS — A419 Sepsis, unspecified organism: Secondary | ICD-10-CM | POA: Diagnosis not present

## 2016-03-30 DIAGNOSIS — N390 Urinary tract infection, site not specified: Secondary | ICD-10-CM | POA: Diagnosis not present

## 2016-03-30 DIAGNOSIS — L89152 Pressure ulcer of sacral region, stage 2: Secondary | ICD-10-CM | POA: Diagnosis not present

## 2016-03-30 DIAGNOSIS — G9341 Metabolic encephalopathy: Secondary | ICD-10-CM | POA: Diagnosis not present

## 2016-03-30 DIAGNOSIS — E119 Type 2 diabetes mellitus without complications: Secondary | ICD-10-CM | POA: Diagnosis not present

## 2016-03-31 DIAGNOSIS — E119 Type 2 diabetes mellitus without complications: Secondary | ICD-10-CM | POA: Diagnosis not present

## 2016-03-31 DIAGNOSIS — G9341 Metabolic encephalopathy: Secondary | ICD-10-CM | POA: Diagnosis not present

## 2016-03-31 DIAGNOSIS — N390 Urinary tract infection, site not specified: Secondary | ICD-10-CM | POA: Diagnosis not present

## 2016-03-31 DIAGNOSIS — J634 Siderosis: Secondary | ICD-10-CM | POA: Diagnosis not present

## 2016-03-31 DIAGNOSIS — L89152 Pressure ulcer of sacral region, stage 2: Secondary | ICD-10-CM | POA: Diagnosis not present

## 2016-03-31 DIAGNOSIS — A419 Sepsis, unspecified organism: Secondary | ICD-10-CM | POA: Diagnosis not present

## 2016-04-03 DIAGNOSIS — E538 Deficiency of other specified B group vitamins: Secondary | ICD-10-CM | POA: Diagnosis not present

## 2016-04-03 DIAGNOSIS — M1990 Unspecified osteoarthritis, unspecified site: Secondary | ICD-10-CM | POA: Diagnosis not present

## 2016-04-03 DIAGNOSIS — Z7982 Long term (current) use of aspirin: Secondary | ICD-10-CM | POA: Diagnosis not present

## 2016-04-03 DIAGNOSIS — L89153 Pressure ulcer of sacral region, stage 3: Secondary | ICD-10-CM | POA: Diagnosis not present

## 2016-04-03 DIAGNOSIS — E559 Vitamin D deficiency, unspecified: Secondary | ICD-10-CM | POA: Diagnosis not present

## 2016-04-03 DIAGNOSIS — M868X8 Other osteomyelitis, other site: Secondary | ICD-10-CM | POA: Diagnosis not present

## 2016-04-03 DIAGNOSIS — I10 Essential (primary) hypertension: Secondary | ICD-10-CM | POA: Diagnosis not present

## 2016-04-03 DIAGNOSIS — Z79899 Other long term (current) drug therapy: Secondary | ICD-10-CM | POA: Diagnosis not present

## 2016-04-03 DIAGNOSIS — Z87891 Personal history of nicotine dependence: Secondary | ICD-10-CM | POA: Diagnosis not present

## 2016-04-04 DIAGNOSIS — R34 Anuria and oliguria: Secondary | ICD-10-CM | POA: Diagnosis not present

## 2016-04-04 DIAGNOSIS — E7800 Pure hypercholesterolemia, unspecified: Secondary | ICD-10-CM | POA: Diagnosis not present

## 2016-04-04 DIAGNOSIS — N390 Urinary tract infection, site not specified: Secondary | ICD-10-CM | POA: Diagnosis not present

## 2016-04-04 DIAGNOSIS — J634 Siderosis: Secondary | ICD-10-CM | POA: Diagnosis not present

## 2016-04-04 DIAGNOSIS — E213 Hyperparathyroidism, unspecified: Secondary | ICD-10-CM | POA: Diagnosis not present

## 2016-04-04 DIAGNOSIS — R531 Weakness: Secondary | ICD-10-CM | POA: Diagnosis not present

## 2016-04-04 DIAGNOSIS — T191XXD Foreign body in bladder, subsequent encounter: Secondary | ICD-10-CM | POA: Diagnosis not present

## 2016-04-04 DIAGNOSIS — E119 Type 2 diabetes mellitus without complications: Secondary | ICD-10-CM | POA: Diagnosis not present

## 2016-04-04 DIAGNOSIS — I10 Essential (primary) hypertension: Secondary | ICD-10-CM | POA: Diagnosis not present

## 2016-04-04 DIAGNOSIS — R410 Disorientation, unspecified: Secondary | ICD-10-CM | POA: Diagnosis not present

## 2016-04-04 DIAGNOSIS — R1032 Left lower quadrant pain: Secondary | ICD-10-CM | POA: Diagnosis not present

## 2016-04-04 DIAGNOSIS — L89152 Pressure ulcer of sacral region, stage 2: Secondary | ICD-10-CM | POA: Diagnosis not present

## 2016-04-04 DIAGNOSIS — Z888 Allergy status to other drugs, medicaments and biological substances status: Secondary | ICD-10-CM | POA: Diagnosis not present

## 2016-04-04 DIAGNOSIS — G9341 Metabolic encephalopathy: Secondary | ICD-10-CM | POA: Diagnosis not present

## 2016-04-04 DIAGNOSIS — A419 Sepsis, unspecified organism: Secondary | ICD-10-CM | POA: Diagnosis not present

## 2016-04-05 DIAGNOSIS — A419 Sepsis, unspecified organism: Secondary | ICD-10-CM | POA: Diagnosis not present

## 2016-04-05 DIAGNOSIS — J634 Siderosis: Secondary | ICD-10-CM | POA: Diagnosis not present

## 2016-04-05 DIAGNOSIS — G9341 Metabolic encephalopathy: Secondary | ICD-10-CM | POA: Diagnosis not present

## 2016-04-05 DIAGNOSIS — R531 Weakness: Secondary | ICD-10-CM | POA: Diagnosis not present

## 2016-04-05 DIAGNOSIS — E7800 Pure hypercholesterolemia, unspecified: Secondary | ICD-10-CM | POA: Diagnosis not present

## 2016-04-05 DIAGNOSIS — L89152 Pressure ulcer of sacral region, stage 2: Secondary | ICD-10-CM | POA: Diagnosis not present

## 2016-04-05 DIAGNOSIS — R1032 Left lower quadrant pain: Secondary | ICD-10-CM | POA: Diagnosis not present

## 2016-04-05 DIAGNOSIS — Z888 Allergy status to other drugs, medicaments and biological substances status: Secondary | ICD-10-CM | POA: Diagnosis not present

## 2016-04-05 DIAGNOSIS — E213 Hyperparathyroidism, unspecified: Secondary | ICD-10-CM | POA: Diagnosis not present

## 2016-04-05 DIAGNOSIS — R34 Anuria and oliguria: Secondary | ICD-10-CM | POA: Diagnosis not present

## 2016-04-05 DIAGNOSIS — I10 Essential (primary) hypertension: Secondary | ICD-10-CM | POA: Diagnosis not present

## 2016-04-05 DIAGNOSIS — R410 Disorientation, unspecified: Secondary | ICD-10-CM | POA: Diagnosis not present

## 2016-04-05 DIAGNOSIS — E119 Type 2 diabetes mellitus without complications: Secondary | ICD-10-CM | POA: Diagnosis not present

## 2016-04-05 DIAGNOSIS — N390 Urinary tract infection, site not specified: Secondary | ICD-10-CM | POA: Diagnosis not present

## 2016-04-07 DIAGNOSIS — G9341 Metabolic encephalopathy: Secondary | ICD-10-CM | POA: Diagnosis not present

## 2016-04-07 DIAGNOSIS — A419 Sepsis, unspecified organism: Secondary | ICD-10-CM | POA: Diagnosis not present

## 2016-04-07 DIAGNOSIS — E11622 Type 2 diabetes mellitus with other skin ulcer: Secondary | ICD-10-CM | POA: Diagnosis not present

## 2016-04-07 DIAGNOSIS — J634 Siderosis: Secondary | ICD-10-CM | POA: Diagnosis not present

## 2016-04-07 DIAGNOSIS — L89153 Pressure ulcer of sacral region, stage 3: Secondary | ICD-10-CM | POA: Diagnosis not present

## 2016-04-07 DIAGNOSIS — L89152 Pressure ulcer of sacral region, stage 2: Secondary | ICD-10-CM | POA: Diagnosis not present

## 2016-04-07 DIAGNOSIS — E119 Type 2 diabetes mellitus without complications: Secondary | ICD-10-CM | POA: Diagnosis not present

## 2016-04-07 DIAGNOSIS — N390 Urinary tract infection, site not specified: Secondary | ICD-10-CM | POA: Diagnosis not present

## 2016-04-10 DIAGNOSIS — E559 Vitamin D deficiency, unspecified: Secondary | ICD-10-CM | POA: Diagnosis not present

## 2016-04-10 DIAGNOSIS — M1990 Unspecified osteoarthritis, unspecified site: Secondary | ICD-10-CM | POA: Diagnosis not present

## 2016-04-10 DIAGNOSIS — I10 Essential (primary) hypertension: Secondary | ICD-10-CM | POA: Diagnosis not present

## 2016-04-10 DIAGNOSIS — Z79899 Other long term (current) drug therapy: Secondary | ICD-10-CM | POA: Diagnosis not present

## 2016-04-10 DIAGNOSIS — M868X8 Other osteomyelitis, other site: Secondary | ICD-10-CM | POA: Diagnosis not present

## 2016-04-10 DIAGNOSIS — E538 Deficiency of other specified B group vitamins: Secondary | ICD-10-CM | POA: Diagnosis not present

## 2016-04-10 DIAGNOSIS — Z7982 Long term (current) use of aspirin: Secondary | ICD-10-CM | POA: Diagnosis not present

## 2016-04-10 DIAGNOSIS — L89153 Pressure ulcer of sacral region, stage 3: Secondary | ICD-10-CM | POA: Diagnosis not present

## 2016-04-10 DIAGNOSIS — M8618 Other acute osteomyelitis, other site: Secondary | ICD-10-CM | POA: Diagnosis not present

## 2016-04-10 DIAGNOSIS — Z87891 Personal history of nicotine dependence: Secondary | ICD-10-CM | POA: Diagnosis not present

## 2016-04-12 DIAGNOSIS — E119 Type 2 diabetes mellitus without complications: Secondary | ICD-10-CM | POA: Diagnosis not present

## 2016-04-12 DIAGNOSIS — G9341 Metabolic encephalopathy: Secondary | ICD-10-CM | POA: Diagnosis not present

## 2016-04-12 DIAGNOSIS — L89152 Pressure ulcer of sacral region, stage 2: Secondary | ICD-10-CM | POA: Diagnosis not present

## 2016-04-12 DIAGNOSIS — J634 Siderosis: Secondary | ICD-10-CM | POA: Diagnosis not present

## 2016-04-12 DIAGNOSIS — N390 Urinary tract infection, site not specified: Secondary | ICD-10-CM | POA: Diagnosis not present

## 2016-04-12 DIAGNOSIS — A419 Sepsis, unspecified organism: Secondary | ICD-10-CM | POA: Diagnosis not present

## 2016-04-14 DIAGNOSIS — G9341 Metabolic encephalopathy: Secondary | ICD-10-CM | POA: Diagnosis not present

## 2016-04-14 DIAGNOSIS — J634 Siderosis: Secondary | ICD-10-CM | POA: Diagnosis not present

## 2016-04-14 DIAGNOSIS — E119 Type 2 diabetes mellitus without complications: Secondary | ICD-10-CM | POA: Diagnosis not present

## 2016-04-14 DIAGNOSIS — N390 Urinary tract infection, site not specified: Secondary | ICD-10-CM | POA: Diagnosis not present

## 2016-04-14 DIAGNOSIS — A419 Sepsis, unspecified organism: Secondary | ICD-10-CM | POA: Diagnosis not present

## 2016-04-14 DIAGNOSIS — L89152 Pressure ulcer of sacral region, stage 2: Secondary | ICD-10-CM | POA: Diagnosis not present

## 2016-04-17 DIAGNOSIS — G9341 Metabolic encephalopathy: Secondary | ICD-10-CM | POA: Diagnosis not present

## 2016-04-17 DIAGNOSIS — L89152 Pressure ulcer of sacral region, stage 2: Secondary | ICD-10-CM | POA: Diagnosis not present

## 2016-04-17 DIAGNOSIS — A419 Sepsis, unspecified organism: Secondary | ICD-10-CM | POA: Diagnosis not present

## 2016-04-17 DIAGNOSIS — J634 Siderosis: Secondary | ICD-10-CM | POA: Diagnosis not present

## 2016-04-17 DIAGNOSIS — E119 Type 2 diabetes mellitus without complications: Secondary | ICD-10-CM | POA: Diagnosis not present

## 2016-04-17 DIAGNOSIS — N390 Urinary tract infection, site not specified: Secondary | ICD-10-CM | POA: Diagnosis not present

## 2016-04-17 IMAGING — DX DG RIBS W/ CHEST 3+V*R*
5 series · 5 of 5 positions shown · non-contrast
Comparison: None.

CLINICAL DATA: Fall, right rib pain

EXAM:
RIGHT RIBS AND CHEST - 3+ VIEW

[rib ap (1 of 2)]
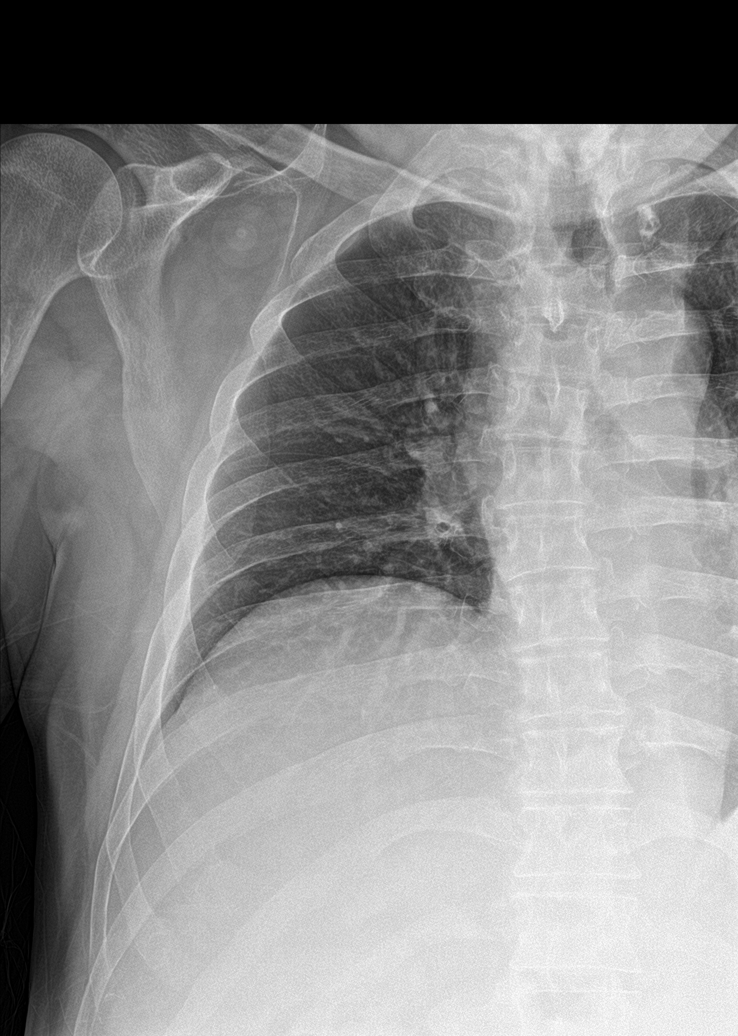

[rib ap (2 of 2)]
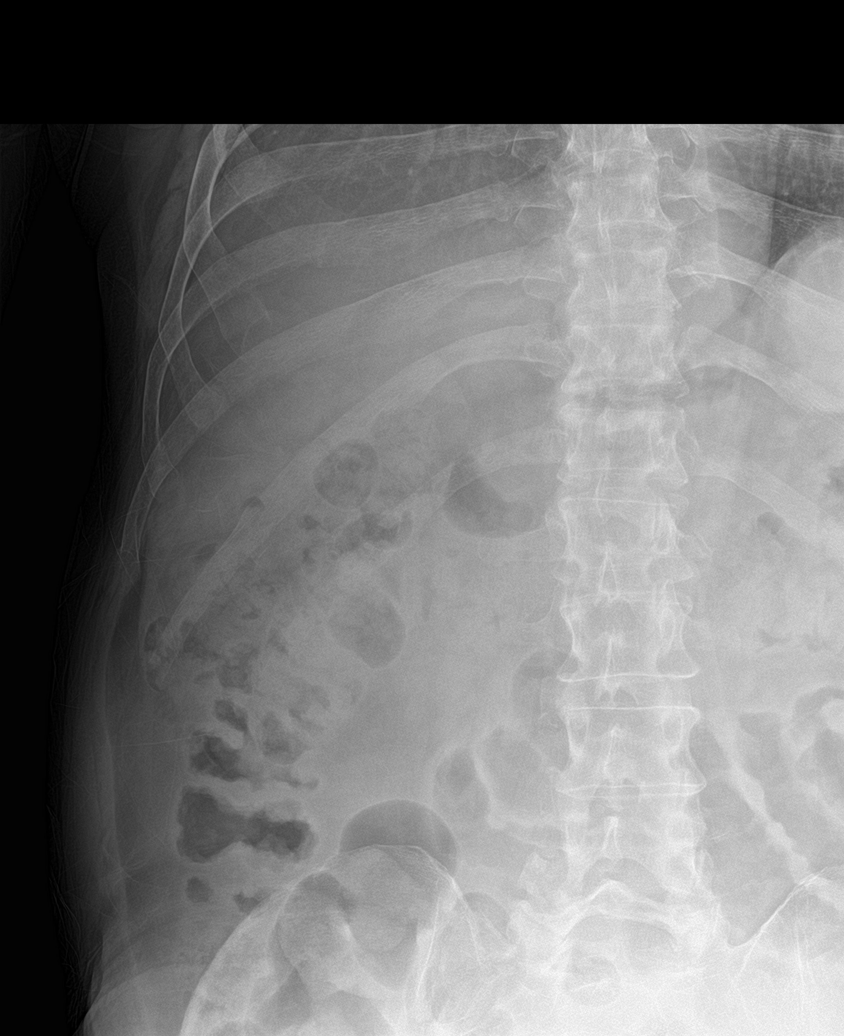

[rib ap obl (1 of 2)]
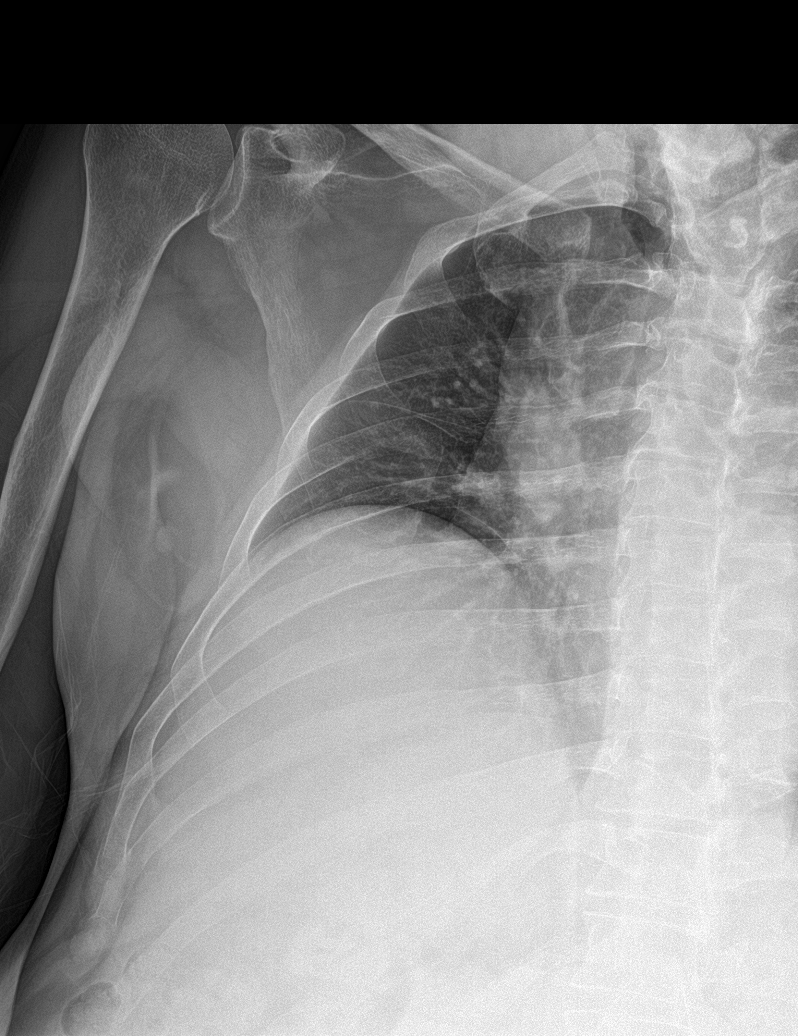

[rib ap obl (2 of 2)]
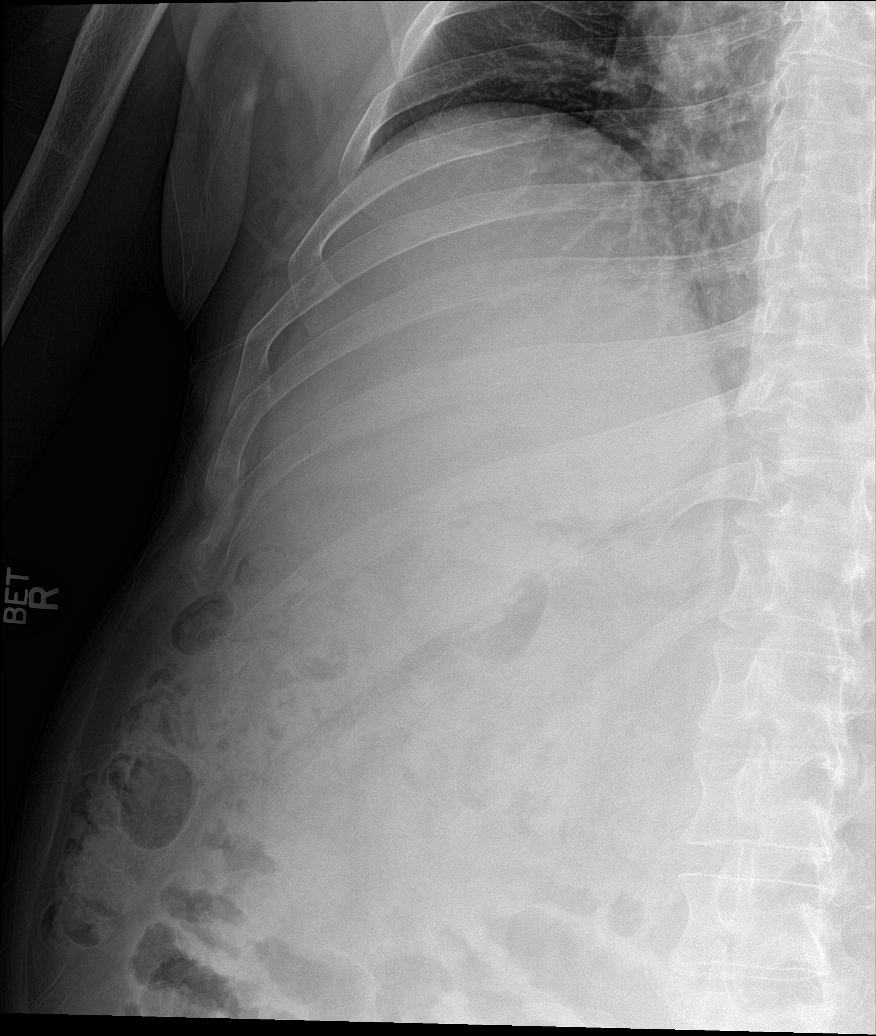

[chest ap]
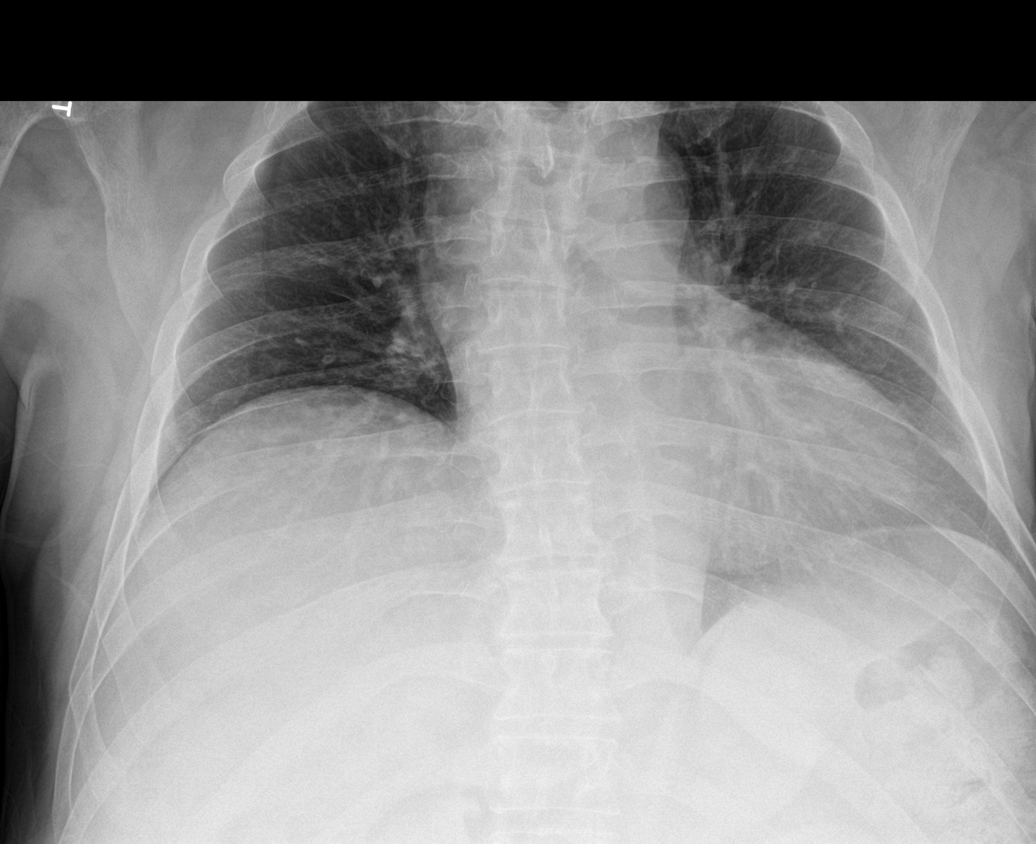

[5 of 5 positions shown; findings below may reference images not displayed]

FINDINGS: No fracture or other bone lesions are seen involving the ribs. There
is no evidence of pneumothorax or pleural effusion. Both lungs are
clear. Heart size and mediastinal contours are within normal limits.
IMPRESSION: Negative.

## 2016-04-18 DIAGNOSIS — M1990 Unspecified osteoarthritis, unspecified site: Secondary | ICD-10-CM | POA: Diagnosis not present

## 2016-04-18 DIAGNOSIS — Z7982 Long term (current) use of aspirin: Secondary | ICD-10-CM | POA: Diagnosis not present

## 2016-04-18 DIAGNOSIS — L89152 Pressure ulcer of sacral region, stage 2: Secondary | ICD-10-CM | POA: Diagnosis not present

## 2016-04-18 DIAGNOSIS — L89153 Pressure ulcer of sacral region, stage 3: Secondary | ICD-10-CM | POA: Diagnosis not present

## 2016-04-18 DIAGNOSIS — M869 Osteomyelitis, unspecified: Secondary | ICD-10-CM | POA: Diagnosis not present

## 2016-04-18 DIAGNOSIS — Z87891 Personal history of nicotine dependence: Secondary | ICD-10-CM | POA: Diagnosis not present

## 2016-04-18 DIAGNOSIS — E559 Vitamin D deficiency, unspecified: Secondary | ICD-10-CM | POA: Diagnosis not present

## 2016-04-18 DIAGNOSIS — E538 Deficiency of other specified B group vitamins: Secondary | ICD-10-CM | POA: Diagnosis not present

## 2016-04-18 DIAGNOSIS — Z79899 Other long term (current) drug therapy: Secondary | ICD-10-CM | POA: Diagnosis not present

## 2016-04-18 DIAGNOSIS — I10 Essential (primary) hypertension: Secondary | ICD-10-CM | POA: Diagnosis not present

## 2016-04-18 DIAGNOSIS — M868X8 Other osteomyelitis, other site: Secondary | ICD-10-CM | POA: Diagnosis not present

## 2016-04-19 DIAGNOSIS — G3184 Mild cognitive impairment, so stated: Secondary | ICD-10-CM | POA: Diagnosis not present

## 2016-04-19 DIAGNOSIS — L89153 Pressure ulcer of sacral region, stage 3: Secondary | ICD-10-CM | POA: Diagnosis not present

## 2016-04-19 DIAGNOSIS — D643 Other sideroblastic anemias: Secondary | ICD-10-CM | POA: Diagnosis not present

## 2016-04-19 DIAGNOSIS — J634 Siderosis: Secondary | ICD-10-CM | POA: Diagnosis not present

## 2016-04-19 DIAGNOSIS — E11622 Type 2 diabetes mellitus with other skin ulcer: Secondary | ICD-10-CM | POA: Diagnosis not present

## 2016-04-20 DIAGNOSIS — R339 Retention of urine, unspecified: Secondary | ICD-10-CM | POA: Diagnosis not present

## 2016-04-20 DIAGNOSIS — N319 Neuromuscular dysfunction of bladder, unspecified: Secondary | ICD-10-CM | POA: Diagnosis not present

## 2016-04-21 DIAGNOSIS — G9341 Metabolic encephalopathy: Secondary | ICD-10-CM | POA: Diagnosis not present

## 2016-04-21 DIAGNOSIS — A419 Sepsis, unspecified organism: Secondary | ICD-10-CM | POA: Diagnosis not present

## 2016-04-21 DIAGNOSIS — J634 Siderosis: Secondary | ICD-10-CM | POA: Diagnosis not present

## 2016-04-21 DIAGNOSIS — L89152 Pressure ulcer of sacral region, stage 2: Secondary | ICD-10-CM | POA: Diagnosis not present

## 2016-04-21 DIAGNOSIS — N390 Urinary tract infection, site not specified: Secondary | ICD-10-CM | POA: Diagnosis not present

## 2016-04-21 DIAGNOSIS — E119 Type 2 diabetes mellitus without complications: Secondary | ICD-10-CM | POA: Diagnosis not present

## 2016-04-24 DIAGNOSIS — R41841 Cognitive communication deficit: Secondary | ICD-10-CM | POA: Diagnosis not present

## 2016-04-24 DIAGNOSIS — E1122 Type 2 diabetes mellitus with diabetic chronic kidney disease: Secondary | ICD-10-CM | POA: Diagnosis not present

## 2016-04-24 DIAGNOSIS — R6521 Severe sepsis with septic shock: Secondary | ICD-10-CM | POA: Diagnosis not present

## 2016-04-24 DIAGNOSIS — G9341 Metabolic encephalopathy: Secondary | ICD-10-CM | POA: Diagnosis not present

## 2016-04-24 DIAGNOSIS — M4628 Osteomyelitis of vertebra, sacral and sacrococcygeal region: Secondary | ICD-10-CM | POA: Diagnosis not present

## 2016-04-24 DIAGNOSIS — N390 Urinary tract infection, site not specified: Secondary | ICD-10-CM | POA: Diagnosis not present

## 2016-04-24 DIAGNOSIS — A419 Sepsis, unspecified organism: Secondary | ICD-10-CM | POA: Diagnosis not present

## 2016-04-24 DIAGNOSIS — R1312 Dysphagia, oropharyngeal phase: Secondary | ICD-10-CM | POA: Diagnosis not present

## 2016-04-24 DIAGNOSIS — K219 Gastro-esophageal reflux disease without esophagitis: Secondary | ICD-10-CM | POA: Diagnosis not present

## 2016-04-24 DIAGNOSIS — J690 Pneumonitis due to inhalation of food and vomit: Secondary | ICD-10-CM | POA: Diagnosis not present

## 2016-04-24 DIAGNOSIS — J9601 Acute respiratory failure with hypoxia: Secondary | ICD-10-CM | POA: Diagnosis not present

## 2016-04-24 DIAGNOSIS — R4701 Aphasia: Secondary | ICD-10-CM | POA: Diagnosis not present

## 2016-04-24 DIAGNOSIS — L89159 Pressure ulcer of sacral region, unspecified stage: Secondary | ICD-10-CM | POA: Diagnosis not present

## 2016-04-24 DIAGNOSIS — N179 Acute kidney failure, unspecified: Secondary | ICD-10-CM | POA: Diagnosis not present

## 2016-04-24 DIAGNOSIS — E785 Hyperlipidemia, unspecified: Secondary | ICD-10-CM | POA: Diagnosis not present

## 2016-04-24 DIAGNOSIS — T83518A Infection and inflammatory reaction due to other urinary catheter, initial encounter: Secondary | ICD-10-CM | POA: Diagnosis not present

## 2016-04-24 DIAGNOSIS — E872 Acidosis: Secondary | ICD-10-CM | POA: Diagnosis not present

## 2016-04-24 DIAGNOSIS — R569 Unspecified convulsions: Secondary | ICD-10-CM | POA: Diagnosis not present

## 2016-04-24 DIAGNOSIS — M6281 Muscle weakness (generalized): Secondary | ICD-10-CM | POA: Diagnosis not present

## 2016-04-24 NOTE — Patient Instructions (Signed)
Travis Phillips  04/24/2016   Your procedure is scheduled on: 05-02-16  Report to [facility]  Entrance take [facility]  elevators to 3rd floor to  [place] at 530AM.  Call this number if you have problems the morning of surgery [phone]   Remember: ONLY 1 PERSON MAY GO WITH YOU TO SHORT STAY TO GET  READY MORNING OF YOUR SURGERY.  Do not eat food or drink liquids :After Midnight.     Take these medicines the morning of surgery with A SIP OF WATER: amitriptyline(elavil), carbamazepine(epitol), metoprolol)lopressor), omeprazole(prilosec), tamsulosin(flomax)   DO NOT TAKE ANY DIABETIC MEDICATIONS DAY OF YOUR SURGERY                               You may not have any metal on your body including hair pins and              piercings  Do not wear jewelry, make-up, lotions, powders or perfumes, deodorant             Do not wear nail polish.  Do not shave  48 hours prior to surgery.              Men may shave face and neck.   Do not bring valuables to the hospital. [place].  Contacts, dentures or bridgework may not be worn into surgery.  Leave suitcase in the car. After surgery it may be brought to your room.     Patients discharged the day of surgery will not be allowed to drive home.  Name and phone number of your driver:  Special Instructions: N/A              Please read over the following fact sheets you were given: _____________________________________________________________________             How to Manage Your Diabetes Before and After Surgery  Why is it important to control my blood sugar before and after surgery? . Improving blood sugar levels before and after surgery helps healing and can limit problems. . A way of improving blood sugar control is eating a healthy diet by: o  Eating less sugar and carbohydrates o  Increasing activity/exercise o  Talking with your doctor about reaching  your blood sugar goals . High blood sugars (greater than 180 mg/dL) can raise your risk of infections and slow your recovery, so you will need to focus on controlling your diabetes during the weeks before surgery. . Make sure that the doctor who takes care of your diabetes knows about your planned surgery including the date and location.  How do I manage my blood sugar before surgery? . Check your blood sugar at least 4 times a day, starting 2 days before surgery, to make sure that the level is not too high or low. o Check your blood sugar the morning of your surgery when you wake up and every 2 hours until you get to the Short Stay unit. . If your blood sugar is less than 70 mg/dL, you will need to treat for low blood sugar: o Do not take insulin. o Treat a low blood sugar (less than 70 mg/dL) with  cup of clear juice (cranberry or apple),  4 glucose tablets, OR glucose gel. o Recheck blood sugar in 15 minutes after treatment (to make sure it is greater than 70 mg/dL). If your blood sugar is not greater than 70 mg/dL on recheck, call [phone] for further instructions. . Report your blood sugar to the short stay nurse when you get to Short Stay.  . If you are admitted to the hospital after surgery: o Your blood sugar will be checked by the staff and you will probably be given insulin after surgery (instead of oral diabetes medicines) to make sure you have good blood sugar levels. o The goal for blood sugar control after surgery is 80-180 mg/dL.   WHAT DO I DO ABOUT MY DIABETES MEDICATION?  Marland Kitchen Do not take oral diabetes medicines (pills) the morning of surgery.  . THE DAY BEFORE SURGERY 05-01-16, take  usual doses of ACTOS        . THE MORNING OF SURGERY 05-02-16, DO NOT TAKE ACTOS   Patient Signature:  Date:   Nurse Signature:  Date:   Reviewed and Endorsed by [facility] Patient Education Committee, August 2015  [facility] - Preparing for Surgery Before surgery, you can play an  important role.  Because skin is not sterile, your skin needs to be as free of germs as possible.  You can reduce the number of germs on your skin by washing with CHG (chlorahexidine gluconate) soap before surgery.  CHG is an antiseptic cleaner which kills germs and bonds with the skin to continue killing germs even after washing. Please DO NOT use if you have an allergy to CHG or antibacterial soaps.  If your skin becomes reddened/irritated stop using the CHG and inform your nurse when you arrive at Short Stay. Do not shave (including legs and underarms) for at least 48 hours prior to the first CHG shower.  You may shave your face/neck. Please follow these instructions carefully:  1.  Shower with CHG Soap the night before surgery and the  morning of Surgery.  2.  If you choose to wash your hair, wash your hair first as usual with your  normal  shampoo.  3.  After you shampoo, rinse your hair and body thoroughly to remove the  shampoo.                           4.  Use CHG as you would any other liquid soap.  You can apply chg directly  to the skin and wash                       Gently with a scrungie or clean washcloth.  5.  Apply the CHG Soap to your body ONLY FROM THE NECK DOWN.   Do not use on face/ open                           Wound or open sores. Avoid contact with eyes, ears mouth and genitals (private parts).                       Wash face,  Genitals (private parts) with your normal soap.             6.  Wash thoroughly, paying special attention to the area where your surgery  will be performed.  7.  Thoroughly rinse your body with warm water from the neck down.  8.  DO NOT shower/wash with your normal soap after using and rinsing off  the CHG Soap.                9.  Pat yourself dry with a clean towel.            10.  Wear clean pajamas.            11.  Place clean sheets on your bed the night of your first shower and do not  sleep with pets. Day of Surgery : Do not apply any  lotions/deodorants the morning of surgery.  Please wear clean clothes to the hospital/surgery center.  FAILURE TO FOLLOW THESE INSTRUCTIONS MAY RESULT IN THE CANCELLATION OF YOUR SURGERY PATIENT SIGNATURE_________________________________  NURSE SIGNATURE__________________________________  ________________________________________________________________________

## 2016-04-24 NOTE — Progress Notes (Signed)
LOV Dr Gerarda Fraction 03-16-16 on chart EKG 07-21-15 epic

## 2016-04-25 ENCOUNTER — Encounter (HOSPITAL_COMMUNITY)
Admission: RE | Admit: 2016-04-25 | Discharge: 2016-04-25 | Disposition: A | Discharge status: Home / Self Care | Payer: Medicare HMO | Source: Ambulatory Visit | Attending: Surgery | Admitting: Surgery

## 2016-04-29 DIAGNOSIS — R6521 Severe sepsis with septic shock: Secondary | ICD-10-CM | POA: Diagnosis not present

## 2016-04-29 DIAGNOSIS — M4628 Osteomyelitis of vertebra, sacral and sacrococcygeal region: Secondary | ICD-10-CM | POA: Diagnosis not present

## 2016-04-29 DIAGNOSIS — A419 Sepsis, unspecified organism: Secondary | ICD-10-CM | POA: Diagnosis not present

## 2016-04-29 DIAGNOSIS — E872 Acidosis: Secondary | ICD-10-CM | POA: Diagnosis not present

## 2016-04-29 DIAGNOSIS — N390 Urinary tract infection, site not specified: Secondary | ICD-10-CM | POA: Diagnosis not present

## 2016-04-29 DIAGNOSIS — T83518A Infection and inflammatory reaction due to other urinary catheter, initial encounter: Secondary | ICD-10-CM | POA: Diagnosis not present

## 2016-04-29 DIAGNOSIS — J9601 Acute respiratory failure with hypoxia: Secondary | ICD-10-CM | POA: Diagnosis not present

## 2016-04-29 DIAGNOSIS — G9341 Metabolic encephalopathy: Secondary | ICD-10-CM | POA: Diagnosis not present

## 2016-04-29 DIAGNOSIS — J690 Pneumonitis due to inhalation of food and vomit: Secondary | ICD-10-CM | POA: Diagnosis not present

## 2016-05-02 ENCOUNTER — Encounter (HOSPITAL_COMMUNITY): Admission: RE | Payer: Self-pay | Source: Ambulatory Visit

## 2016-05-02 ENCOUNTER — Ambulatory Visit (HOSPITAL_COMMUNITY): Admission: RE | Admit: 2016-05-02 | Payer: Medicare HMO | Source: Ambulatory Visit | Admitting: Surgery

## 2016-05-02 DIAGNOSIS — E872 Acidosis: Secondary | ICD-10-CM | POA: Diagnosis not present

## 2016-05-02 DIAGNOSIS — T83518A Infection and inflammatory reaction due to other urinary catheter, initial encounter: Secondary | ICD-10-CM | POA: Diagnosis not present

## 2016-05-02 DIAGNOSIS — R6521 Severe sepsis with septic shock: Secondary | ICD-10-CM | POA: Diagnosis not present

## 2016-05-02 DIAGNOSIS — R4701 Aphasia: Secondary | ICD-10-CM | POA: Diagnosis not present

## 2016-05-02 DIAGNOSIS — K219 Gastro-esophageal reflux disease without esophagitis: Secondary | ICD-10-CM | POA: Diagnosis not present

## 2016-05-02 DIAGNOSIS — M6281 Muscle weakness (generalized): Secondary | ICD-10-CM | POA: Diagnosis not present

## 2016-05-02 DIAGNOSIS — R569 Unspecified convulsions: Secondary | ICD-10-CM | POA: Diagnosis not present

## 2016-05-02 DIAGNOSIS — R41841 Cognitive communication deficit: Secondary | ICD-10-CM | POA: Diagnosis not present

## 2016-05-02 DIAGNOSIS — E785 Hyperlipidemia, unspecified: Secondary | ICD-10-CM | POA: Diagnosis not present

## 2016-05-02 DIAGNOSIS — R1312 Dysphagia, oropharyngeal phase: Secondary | ICD-10-CM | POA: Diagnosis not present

## 2016-05-02 DIAGNOSIS — J690 Pneumonitis due to inhalation of food and vomit: Secondary | ICD-10-CM | POA: Diagnosis not present

## 2016-05-02 DIAGNOSIS — M4628 Osteomyelitis of vertebra, sacral and sacrococcygeal region: Secondary | ICD-10-CM | POA: Diagnosis not present

## 2016-05-02 DIAGNOSIS — G9341 Metabolic encephalopathy: Secondary | ICD-10-CM | POA: Diagnosis not present

## 2016-05-02 DIAGNOSIS — N390 Urinary tract infection, site not specified: Secondary | ICD-10-CM | POA: Diagnosis not present

## 2016-05-02 DIAGNOSIS — A419 Sepsis, unspecified organism: Secondary | ICD-10-CM | POA: Diagnosis not present

## 2016-05-02 DIAGNOSIS — J9601 Acute respiratory failure with hypoxia: Secondary | ICD-10-CM | POA: Diagnosis not present

## 2016-05-02 SURGERY — PARATHYROIDECTOMY
Anesthesia: General | Laterality: Left

## 2016-05-06 DIAGNOSIS — R000 Tachycardia, unspecified: Secondary | ICD-10-CM | POA: Diagnosis not present

## 2016-05-06 DIAGNOSIS — E872 Acidosis: Secondary | ICD-10-CM | POA: Diagnosis not present

## 2016-05-06 DIAGNOSIS — M6281 Muscle weakness (generalized): Secondary | ICD-10-CM | POA: Diagnosis not present

## 2016-05-06 DIAGNOSIS — R41841 Cognitive communication deficit: Secondary | ICD-10-CM | POA: Diagnosis not present

## 2016-05-06 DIAGNOSIS — J690 Pneumonitis due to inhalation of food and vomit: Secondary | ICD-10-CM | POA: Diagnosis not present

## 2016-05-06 DIAGNOSIS — R1312 Dysphagia, oropharyngeal phase: Secondary | ICD-10-CM | POA: Diagnosis not present

## 2016-05-06 DIAGNOSIS — J9601 Acute respiratory failure with hypoxia: Secondary | ICD-10-CM | POA: Diagnosis not present

## 2016-05-06 DIAGNOSIS — G9341 Metabolic encephalopathy: Secondary | ICD-10-CM | POA: Diagnosis not present

## 2016-05-06 DIAGNOSIS — M4628 Osteomyelitis of vertebra, sacral and sacrococcygeal region: Secondary | ICD-10-CM | POA: Diagnosis not present

## 2016-05-06 DIAGNOSIS — K219 Gastro-esophageal reflux disease without esophagitis: Secondary | ICD-10-CM | POA: Diagnosis not present

## 2016-05-06 DIAGNOSIS — T83518A Infection and inflammatory reaction due to other urinary catheter, initial encounter: Secondary | ICD-10-CM | POA: Diagnosis not present

## 2016-05-06 DIAGNOSIS — R569 Unspecified convulsions: Secondary | ICD-10-CM | POA: Diagnosis not present

## 2016-05-06 DIAGNOSIS — E785 Hyperlipidemia, unspecified: Secondary | ICD-10-CM | POA: Diagnosis not present

## 2016-05-06 DIAGNOSIS — E119 Type 2 diabetes mellitus without complications: Secondary | ICD-10-CM | POA: Diagnosis not present

## 2016-05-06 DIAGNOSIS — N390 Urinary tract infection, site not specified: Secondary | ICD-10-CM | POA: Diagnosis not present

## 2016-05-06 DIAGNOSIS — R6521 Severe sepsis with septic shock: Secondary | ICD-10-CM | POA: Diagnosis not present

## 2016-05-06 DIAGNOSIS — R4701 Aphasia: Secondary | ICD-10-CM | POA: Diagnosis not present

## 2016-05-06 DIAGNOSIS — A419 Sepsis, unspecified organism: Secondary | ICD-10-CM | POA: Diagnosis not present

## 2016-05-10 DIAGNOSIS — N390 Urinary tract infection, site not specified: Secondary | ICD-10-CM | POA: Diagnosis not present

## 2016-05-10 DIAGNOSIS — M4628 Osteomyelitis of vertebra, sacral and sacrococcygeal region: Secondary | ICD-10-CM | POA: Diagnosis not present

## 2016-05-10 DIAGNOSIS — E119 Type 2 diabetes mellitus without complications: Secondary | ICD-10-CM | POA: Diagnosis not present

## 2016-05-29 DIAGNOSIS — R41841 Cognitive communication deficit: Secondary | ICD-10-CM | POA: Diagnosis not present

## 2016-05-29 DIAGNOSIS — R4701 Aphasia: Secondary | ICD-10-CM | POA: Diagnosis not present

## 2016-05-29 DIAGNOSIS — Z888 Allergy status to other drugs, medicaments and biological substances status: Secondary | ICD-10-CM | POA: Diagnosis not present

## 2016-05-29 DIAGNOSIS — N390 Urinary tract infection, site not specified: Secondary | ICD-10-CM | POA: Diagnosis not present

## 2016-05-29 DIAGNOSIS — E119 Type 2 diabetes mellitus without complications: Secondary | ICD-10-CM | POA: Diagnosis not present

## 2016-05-29 DIAGNOSIS — T83518A Infection and inflammatory reaction due to other urinary catheter, initial encounter: Secondary | ICD-10-CM | POA: Diagnosis not present

## 2016-05-29 DIAGNOSIS — M4628 Osteomyelitis of vertebra, sacral and sacrococcygeal region: Secondary | ICD-10-CM | POA: Diagnosis not present

## 2016-05-29 DIAGNOSIS — M869 Osteomyelitis, unspecified: Secondary | ICD-10-CM | POA: Diagnosis not present

## 2016-05-29 DIAGNOSIS — R1312 Dysphagia, oropharyngeal phase: Secondary | ICD-10-CM | POA: Diagnosis not present

## 2016-05-29 DIAGNOSIS — M6281 Muscle weakness (generalized): Secondary | ICD-10-CM | POA: Diagnosis not present

## 2016-05-29 DIAGNOSIS — A419 Sepsis, unspecified organism: Secondary | ICD-10-CM | POA: Diagnosis not present

## 2016-05-29 DIAGNOSIS — Y846 Urinary catheterization as the cause of abnormal reaction of the patient, or of later complication, without mention of misadventure at the time of the procedure: Secondary | ICD-10-CM | POA: Diagnosis not present

## 2016-05-29 DIAGNOSIS — E860 Dehydration: Secondary | ICD-10-CM | POA: Diagnosis not present

## 2016-05-29 DIAGNOSIS — F329 Major depressive disorder, single episode, unspecified: Secondary | ICD-10-CM | POA: Diagnosis not present

## 2016-05-29 DIAGNOSIS — F0390 Unspecified dementia without behavioral disturbance: Secondary | ICD-10-CM | POA: Diagnosis not present

## 2016-05-29 DIAGNOSIS — T83018A Breakdown (mechanical) of other indwelling urethral catheter, initial encounter: Secondary | ICD-10-CM | POA: Diagnosis not present

## 2016-05-29 DIAGNOSIS — M2550 Pain in unspecified joint: Secondary | ICD-10-CM | POA: Diagnosis not present

## 2016-05-29 DIAGNOSIS — I10 Essential (primary) hypertension: Secondary | ICD-10-CM | POA: Diagnosis not present

## 2016-05-29 DIAGNOSIS — F419 Anxiety disorder, unspecified: Secondary | ICD-10-CM | POA: Diagnosis not present

## 2016-05-29 DIAGNOSIS — Z7401 Bed confinement status: Secondary | ICD-10-CM | POA: Diagnosis not present

## 2016-05-29 DIAGNOSIS — R569 Unspecified convulsions: Secondary | ICD-10-CM | POA: Diagnosis not present

## 2016-05-29 DIAGNOSIS — L89159 Pressure ulcer of sacral region, unspecified stage: Secondary | ICD-10-CM | POA: Diagnosis not present

## 2016-05-29 DIAGNOSIS — E785 Hyperlipidemia, unspecified: Secondary | ICD-10-CM | POA: Diagnosis not present

## 2016-05-29 DIAGNOSIS — K219 Gastro-esophageal reflux disease without esophagitis: Secondary | ICD-10-CM | POA: Diagnosis not present

## 2016-05-29 DIAGNOSIS — R410 Disorientation, unspecified: Secondary | ICD-10-CM | POA: Diagnosis not present

## 2016-05-31 DIAGNOSIS — M869 Osteomyelitis, unspecified: Secondary | ICD-10-CM | POA: Diagnosis not present

## 2016-05-31 DIAGNOSIS — L89159 Pressure ulcer of sacral region, unspecified stage: Secondary | ICD-10-CM | POA: Diagnosis not present

## 2016-06-02 DIAGNOSIS — M4628 Osteomyelitis of vertebra, sacral and sacrococcygeal region: Secondary | ICD-10-CM | POA: Diagnosis not present

## 2016-06-02 DIAGNOSIS — E860 Dehydration: Secondary | ICD-10-CM | POA: Diagnosis not present

## 2016-06-14 DIAGNOSIS — M6281 Muscle weakness (generalized): Secondary | ICD-10-CM | POA: Diagnosis not present

## 2016-06-14 DIAGNOSIS — R41841 Cognitive communication deficit: Secondary | ICD-10-CM | POA: Diagnosis not present

## 2016-06-14 DIAGNOSIS — R1312 Dysphagia, oropharyngeal phase: Secondary | ICD-10-CM | POA: Diagnosis not present

## 2016-06-14 DIAGNOSIS — N3289 Other specified disorders of bladder: Secondary | ICD-10-CM | POA: Diagnosis not present

## 2016-06-14 DIAGNOSIS — A419 Sepsis, unspecified organism: Secondary | ICD-10-CM | POA: Diagnosis not present

## 2016-06-15 DIAGNOSIS — M6281 Muscle weakness (generalized): Secondary | ICD-10-CM | POA: Diagnosis not present

## 2016-06-15 DIAGNOSIS — N3289 Other specified disorders of bladder: Secondary | ICD-10-CM | POA: Diagnosis not present

## 2016-06-15 DIAGNOSIS — R1312 Dysphagia, oropharyngeal phase: Secondary | ICD-10-CM | POA: Diagnosis not present

## 2016-06-15 DIAGNOSIS — A419 Sepsis, unspecified organism: Secondary | ICD-10-CM | POA: Diagnosis not present

## 2016-06-15 DIAGNOSIS — R41841 Cognitive communication deficit: Secondary | ICD-10-CM | POA: Diagnosis not present

## 2016-06-15 NOTE — Progress Notes (Signed)
Please place orders in EPIC as patient is being scheduled for a Pre-op appointment! Thank you! 

## 2016-06-16 DIAGNOSIS — R41841 Cognitive communication deficit: Secondary | ICD-10-CM | POA: Diagnosis not present

## 2016-06-16 DIAGNOSIS — A419 Sepsis, unspecified organism: Secondary | ICD-10-CM | POA: Diagnosis not present

## 2016-06-16 DIAGNOSIS — M6281 Muscle weakness (generalized): Secondary | ICD-10-CM | POA: Diagnosis not present

## 2016-06-16 DIAGNOSIS — N3289 Other specified disorders of bladder: Secondary | ICD-10-CM | POA: Diagnosis not present

## 2016-06-16 DIAGNOSIS — R1312 Dysphagia, oropharyngeal phase: Secondary | ICD-10-CM | POA: Diagnosis not present

## 2016-06-19 ENCOUNTER — Ambulatory Visit: Payer: Self-pay | Admitting: Surgery

## 2016-06-19 DIAGNOSIS — A419 Sepsis, unspecified organism: Secondary | ICD-10-CM | POA: Diagnosis not present

## 2016-06-19 DIAGNOSIS — Z01810 Encounter for preprocedural cardiovascular examination: Secondary | ICD-10-CM | POA: Diagnosis not present

## 2016-06-19 DIAGNOSIS — M6281 Muscle weakness (generalized): Secondary | ICD-10-CM | POA: Diagnosis not present

## 2016-06-19 DIAGNOSIS — N3289 Other specified disorders of bladder: Secondary | ICD-10-CM | POA: Diagnosis not present

## 2016-06-19 DIAGNOSIS — R1312 Dysphagia, oropharyngeal phase: Secondary | ICD-10-CM | POA: Diagnosis not present

## 2016-06-19 DIAGNOSIS — R41841 Cognitive communication deficit: Secondary | ICD-10-CM | POA: Diagnosis not present

## 2016-06-19 NOTE — H&P (Signed)
General Surgery [facility]  Travis Phillips DOB: 1956-01-21 Divorced / Language: Travis Phillips / Race: White Male  History of Present Illness  The patient is a 61 year old male who presents with a parathyroid neoplasm.  Patient is referred by Dr. Renato Shin for evaluation and management of suspected primary hyperparathyroidism. Patient was originally diagnosed with hypercalcemia in 2011. Patient has a complex past medical history. He recently was admitted in [place] with urinary tract infection and obstruction in the left ureter from nephrolithiasis. Laboratory studies from October 2017 show an elevated calcium level of 11.4 and an elevated intact PTH level of 113. Vitamin D level is slightly low at 24.49. Patient has had 24-hour urine collections but those results are not available to me today. Patient has not had any imaging studies. He presents today accompanied by family members for evaluation. Patient has significant hearing loss. He has had previous cervical spine surgery at [facility] from a posterior approach. There is no family history of other endocrine neoplasm or endocrine surgery.   Past Surgical History  Spinal Surgery - Neck   Allergies  Lipitor *ANTIHYPERLIPIDEMICS*  Rash.  Medication History Elavil (10MG  Tablet, Oral three times daily) Active. Aspirin (81MG  Tablet, Oral daily) Active. Baclofen (10MG  Tablet, Oral daily) Active. Vitamin D (Cholecalciferol) (1000UNIT Tablet, Oral daily) Active. Fenofibrate (160MG  Tablet, Oral daily) Active. Lopressor (50MG  Tablet, Oral daily) Active. Actos (15MG  Tablet, Oral daily) Active. Zocor (10MG  Tablet, Oral daily) Active. Flomax (0.4MG  Capsule, Oral daily) Active. Medications Reconciled  Social History Alcohol use  Remotely quit alcohol use. Caffeine use  Coffee, Tea. Illicit drug use  Uses socially only. Tobacco use  Former smoker.  Family History   Arthritis  Father. Hypertension  Mother.  Other Problems  Diabetes Mellitus    Review of Systems Breast Not Present- Breast Mass, Breast Pain, Nipple Discharge and Skin Changes. Cardiovascular Not Present- Chest Pain, Difficulty Breathing Lying Down, Leg Cramps, Palpitations, Rapid Heart Rate, Shortness of Breath and Swelling of Extremities. Gastrointestinal Not Present- Abdominal Pain, Bloating, Bloody Stool, Change in Bowel Habits, Chronic diarrhea, Constipation, Difficulty Swallowing, Excessive gas, Gets full quickly at meals, Hemorrhoids, Indigestion, Nausea, Rectal Pain and Vomiting.  Vitals Weight: 170 lb Height: 72in Body Surface Area: 1.99 m Body Mass Index: 23.06 kg/m  Temp.: 98.32F  Pulse: 86 (Regular)  BP: 102/62 (Sitting, Left Arm, Standard)   Physical Exam  The physical exam findings are as follows: Note:General - appears comfortable, no distress; not diaphorectic; seated in a wheelchair  HEENT - normocephalic; sclerae clear, gaze conjugate; mucous membranes moist, dentition fair; voice slow  Neck - symmetric on extension; no palpable anterior or posterior cervical adenopathy; no palpable masses in the thyroid bed  Chest - clear bilaterally without rhonchi, rales, or wheeze  Cor - regular rhythm with normal rate; no significant murmur  Ext - non-tender without significant edema or lymphedema  Neuro - grossly intact; moderate coarse tremor    Assessment & Plan   PRIMARY HYPERPARATHYROIDISM (E21.0)  Follow Up - Call [facility] office after tests / studies doneto discuss further plans  Patient presents on referral from his endocrinologist for evaluation of hypercalcemia for suspected primary hyperparathyroidism. Patient and his family are provided with written literature on parathyroid surgery to review at home.  After review of his records and discussion with the patient and his family, I have recommended proceeding with nuclear medicine  parathyroid scan. Hopefully this will confirm the diagnosis and localize a parathyroid  adenoma. If so, I believe he will be a good candidate for minimally invasive surgery as an outpatient procedure. If the sestamibi scan fails to localize an adenoma, then we will plan to obtain a high-resolution ultrasound examination of the neck. If this is unrevealing, then we would consider a 4D CT scan of the neck. All of this is in hopes of confirming the diagnosis and performing a minimally invasive procedure.  Patient will undergo nuclear medicine parathyroid scan. We will contact them with the results as soon as they are available.  Pt Education - Pamphlet Given - The Parathyroid Surgery Book: discussed with patient and provided information.  ADDENDUM: Patient underwent nuclear medicine parathyroid scan.  This localized an adenoma to the left inferior position.  Minimally invasive surgery was recommended.  The risks and benefits of the procedure have been discussed at length with the patient.  The patient understands the proposed procedure, potential alternative treatments, and the course of recovery to be expected.  All of the patient's questions have been answered at this time.  The patient wishes to proceed with surgery.  Surgery was delayed due to [place] with urose[place].  Earnstine Regal, MD, [facility] Office: [phone]

## 2016-06-20 DIAGNOSIS — R1312 Dysphagia, oropharyngeal phase: Secondary | ICD-10-CM | POA: Diagnosis not present

## 2016-06-20 DIAGNOSIS — A419 Sepsis, unspecified organism: Secondary | ICD-10-CM | POA: Diagnosis not present

## 2016-06-20 DIAGNOSIS — M6281 Muscle weakness (generalized): Secondary | ICD-10-CM | POA: Diagnosis not present

## 2016-06-21 ENCOUNTER — Encounter (HOSPITAL_COMMUNITY): Payer: Self-pay | Admitting: *Deleted

## 2016-06-22 DIAGNOSIS — A419 Sepsis, unspecified organism: Secondary | ICD-10-CM | POA: Diagnosis not present

## 2016-06-22 DIAGNOSIS — R1312 Dysphagia, oropharyngeal phase: Secondary | ICD-10-CM | POA: Diagnosis not present

## 2016-06-22 DIAGNOSIS — M6281 Muscle weakness (generalized): Secondary | ICD-10-CM | POA: Diagnosis not present

## 2016-06-24 DIAGNOSIS — Z79899 Other long term (current) drug therapy: Secondary | ICD-10-CM | POA: Diagnosis not present

## 2016-06-24 DIAGNOSIS — N390 Urinary tract infection, site not specified: Secondary | ICD-10-CM | POA: Diagnosis not present

## 2016-06-25 DIAGNOSIS — I10 Essential (primary) hypertension: Secondary | ICD-10-CM | POA: Diagnosis not present

## 2016-06-25 DIAGNOSIS — Z7982 Long term (current) use of aspirin: Secondary | ICD-10-CM | POA: Diagnosis not present

## 2016-06-25 DIAGNOSIS — Y829 Unspecified medical devices associated with adverse incidents: Secondary | ICD-10-CM | POA: Diagnosis not present

## 2016-06-25 DIAGNOSIS — T83018A Breakdown (mechanical) of other indwelling urethral catheter, initial encounter: Secondary | ICD-10-CM | POA: Diagnosis not present

## 2016-06-26 DIAGNOSIS — R1312 Dysphagia, oropharyngeal phase: Secondary | ICD-10-CM | POA: Diagnosis not present

## 2016-06-26 DIAGNOSIS — M6281 Muscle weakness (generalized): Secondary | ICD-10-CM | POA: Diagnosis not present

## 2016-06-26 DIAGNOSIS — A419 Sepsis, unspecified organism: Secondary | ICD-10-CM | POA: Diagnosis not present

## 2016-06-26 NOTE — Progress Notes (Addendum)
Preop instructions for:    Travis Phillips                 Date of Birth   1955/06/15                         Date of Procedure:  06/29/2016      Doctor: Dr Armandina Gemma  Time to arrive: 0530am  at [facility]: [place] elevators to 3rd Floor to [place]   Procedure: Left Inferior Parathyroidectomy  Any procedure time changes, MD office will notify you!   Do not eat or drink past midnight the night before your procedure.(To include any tube feedings-must be discontinued) Reminder:Follow bowel prep instructions per MD office!   Take these morning medications only with sips of water.(or give through gastrostomy or feeding tube). Omeprazole ( Prilosec), Metoprolol ( Lopressor), Tamsulosin ( Flomax), Epitol ( Carbamazepine), Baclofen , amitriptyline( Elavil), LEvofloxacin ( Levaquin)  Note: No Insulin or Diabetic meds should be given or taken the morning of the procedure!   Facility contact:  [employer] and [place]:  Transportation contact phone#: insurance co of patient ner [facility]   Please send day of procedure:current med list and meds last taken that day, confirm nothing by mouth status from what time, Patient Demographic info( to include DNR status, problem list, allergies)   RN contact name/phone#:                             and Fax #: [phone] Bring Insurance card and picture ID Leave all jewelry and other valuables at place where living( no metal or rings to be worn) No contact lens Women-no make-up, no lotions,perfumes,powders Men-no colognes,lotions  Any questions day of procedure,call [place]  [phone].     Sent from :[facility]                   [place]                   [phone]  Sent by  : Gillian Shields RN

## 2016-06-27 ENCOUNTER — Encounter: Payer: Self-pay | Admitting: Surgery

## 2016-06-27 DIAGNOSIS — Z8659 Personal history of other mental and behavioral disorders: Secondary | ICD-10-CM | POA: Diagnosis not present

## 2016-06-27 DIAGNOSIS — R41841 Cognitive communication deficit: Secondary | ICD-10-CM | POA: Diagnosis not present

## 2016-06-27 DIAGNOSIS — A419 Sepsis, unspecified organism: Secondary | ICD-10-CM | POA: Diagnosis not present

## 2016-06-27 DIAGNOSIS — R1312 Dysphagia, oropharyngeal phase: Secondary | ICD-10-CM | POA: Diagnosis not present

## 2016-06-27 DIAGNOSIS — M6281 Muscle weakness (generalized): Secondary | ICD-10-CM | POA: Diagnosis not present

## 2016-06-27 DIAGNOSIS — F329 Major depressive disorder, single episode, unspecified: Secondary | ICD-10-CM | POA: Diagnosis not present

## 2016-06-28 DIAGNOSIS — R1312 Dysphagia, oropharyngeal phase: Secondary | ICD-10-CM | POA: Diagnosis not present

## 2016-06-28 DIAGNOSIS — M6281 Muscle weakness (generalized): Secondary | ICD-10-CM | POA: Diagnosis not present

## 2016-06-28 DIAGNOSIS — A419 Sepsis, unspecified organism: Secondary | ICD-10-CM | POA: Diagnosis not present

## 2016-06-29 ENCOUNTER — Ambulatory Visit (HOSPITAL_COMMUNITY): Payer: Medicare HMO

## 2016-06-29 ENCOUNTER — Ambulatory Visit (HOSPITAL_COMMUNITY)
Admission: RE | Admit: 2016-06-29 | Discharge: 2016-06-29 | Disposition: A | Discharge status: Skilled Nursing Facility | Payer: Medicare HMO | Source: Ambulatory Visit | Attending: Surgery | Admitting: Surgery

## 2016-06-29 ENCOUNTER — Encounter (HOSPITAL_COMMUNITY): Payer: Self-pay | Admitting: Surgery

## 2016-06-29 ENCOUNTER — Ambulatory Visit (HOSPITAL_COMMUNITY): Payer: Medicare HMO | Admitting: Certified Registered Nurse Anesthetist

## 2016-06-29 ENCOUNTER — Encounter (HOSPITAL_COMMUNITY)
Admission: RE | Disposition: A | Discharge status: Skilled Nursing Facility | Payer: Self-pay | Source: Ambulatory Visit | Attending: Surgery

## 2016-06-29 DIAGNOSIS — E119 Type 2 diabetes mellitus without complications: Secondary | ICD-10-CM | POA: Insufficient documentation

## 2016-06-29 DIAGNOSIS — K921 Melena: Secondary | ICD-10-CM | POA: Diagnosis not present

## 2016-06-29 DIAGNOSIS — Z7984 Long term (current) use of oral hypoglycemic drugs: Secondary | ICD-10-CM | POA: Diagnosis not present

## 2016-06-29 DIAGNOSIS — I10 Essential (primary) hypertension: Secondary | ICD-10-CM | POA: Insufficient documentation

## 2016-06-29 DIAGNOSIS — Z87891 Personal history of nicotine dependence: Secondary | ICD-10-CM | POA: Insufficient documentation

## 2016-06-29 DIAGNOSIS — E210 Primary hyperparathyroidism: Secondary | ICD-10-CM | POA: Diagnosis not present

## 2016-06-29 DIAGNOSIS — Z7982 Long term (current) use of aspirin: Secondary | ICD-10-CM | POA: Insufficient documentation

## 2016-06-29 DIAGNOSIS — Z87442 Personal history of urinary calculi: Secondary | ICD-10-CM | POA: Insufficient documentation

## 2016-06-29 DIAGNOSIS — Z452 Encounter for adjustment and management of vascular access device: Secondary | ICD-10-CM | POA: Diagnosis not present

## 2016-06-29 DIAGNOSIS — Z01818 Encounter for other preprocedural examination: Secondary | ICD-10-CM

## 2016-06-29 DIAGNOSIS — D351 Benign neoplasm of parathyroid gland: Secondary | ICD-10-CM | POA: Diagnosis not present

## 2016-06-29 DIAGNOSIS — R011 Cardiac murmur, unspecified: Secondary | ICD-10-CM | POA: Diagnosis not present

## 2016-06-29 DIAGNOSIS — Z79899 Other long term (current) drug therapy: Secondary | ICD-10-CM | POA: Insufficient documentation

## 2016-06-29 DIAGNOSIS — K5900 Constipation, unspecified: Secondary | ICD-10-CM | POA: Diagnosis not present

## 2016-06-29 DIAGNOSIS — E213 Hyperparathyroidism, unspecified: Secondary | ICD-10-CM

## 2016-06-29 HISTORY — DX: Other chronic osteomyelitis, unspecified site: M86.60

## 2016-06-29 HISTORY — DX: Benign prostatic hyperplasia without lower urinary tract symptoms: N40.0

## 2016-06-29 HISTORY — DX: Urinary tract infection, site not specified: N39.0

## 2016-06-29 HISTORY — DX: Presence of other specified devices: Z97.8

## 2016-06-29 HISTORY — PX: PARATHYROIDECTOMY: SHX19

## 2016-06-29 HISTORY — DX: Unspecified dementia, unspecified severity, without behavioral disturbance, psychotic disturbance, mood disturbance, and anxiety: F03.90

## 2016-06-29 HISTORY — DX: Personal history of urinary calculi: Z87.442

## 2016-06-29 HISTORY — DX: Presence of urogenital implants: Z96.0

## 2016-06-29 HISTORY — DX: Acute pyelonephritis: N10

## 2016-06-29 HISTORY — DX: Gastrointestinal hemorrhage, unspecified: K92.2

## 2016-06-29 HISTORY — DX: Respiratory failure, unspecified, unspecified whether with hypoxia or hypercapnia: J96.90

## 2016-06-29 HISTORY — DX: Unspecified hearing loss, unspecified ear: H91.90

## 2016-06-29 HISTORY — DX: Pneumonia, unspecified organism: J18.9

## 2016-06-29 HISTORY — DX: Severe sepsis with septic shock: R65.21

## 2016-06-29 HISTORY — DX: Pressure ulcer of sacral region, unspecified stage: L89.159

## 2016-06-29 HISTORY — DX: Unspecified convulsions: R56.9

## 2016-06-29 HISTORY — DX: Sepsis, unspecified organism: A41.9

## 2016-06-29 LAB — GLUCOSE, CAPILLARY
GLUCOSE-CAPILLARY: 114 mg/dL — AB (ref 65–99)
GLUCOSE-CAPILLARY: 120 mg/dL — AB (ref 65–99)
Glucose-Capillary: 127 mg/dL — ABNORMAL HIGH (ref 65–99)

## 2016-06-29 SURGERY — PARATHYROIDECTOMY
Anesthesia: General | Site: Neck | Laterality: Left

## 2016-06-29 MED ORDER — SUCCINYLCHOLINE CHLORIDE 200 MG/10ML IV SOSY
PREFILLED_SYRINGE | INTRAVENOUS | Status: AC
  Filled 2016-06-29: qty 10

## 2016-06-29 MED ORDER — HEPARIN SOD (PORK) LOCK FLUSH 100 UNIT/ML IV SOLN
250.0000 [IU] | INTRAVENOUS | Status: AC | PRN
  Administered 2016-06-29: 250 [IU]

## 2016-06-29 MED ORDER — 0.9 % SODIUM CHLORIDE (POUR BTL) OPTIME
TOPICAL | Status: DC | PRN
  Administered 2016-06-29: 1000 mL

## 2016-06-29 MED ORDER — DEXAMETHASONE SODIUM PHOSPHATE 10 MG/ML IJ SOLN
INTRAMUSCULAR | Status: DC | PRN
  Administered 2016-06-29: 10 mg via INTRAVENOUS

## 2016-06-29 MED ORDER — ROCURONIUM BROMIDE 50 MG/5ML IV SOSY
PREFILLED_SYRINGE | INTRAVENOUS | Status: DC | PRN
  Administered 2016-06-29: 50 mg via INTRAVENOUS

## 2016-06-29 MED ORDER — LIDOCAINE 2% (20 MG/ML) 5 ML SYRINGE
INTRAMUSCULAR | Status: AC
Start: 2016-06-29 — End: 2016-06-29
  Filled 2016-06-29: qty 5

## 2016-06-29 MED ORDER — MIDAZOLAM HCL 2 MG/2ML IJ SOLN
1.0000 mg | Freq: Once | INTRAMUSCULAR | Status: AC
  Administered 2016-06-29: 0.5 mg via INTRAVENOUS

## 2016-06-29 MED ORDER — ONDANSETRON HCL 4 MG/2ML IJ SOLN
INTRAMUSCULAR | Status: DC | PRN
  Administered 2016-06-29: 4 mg via INTRAVENOUS

## 2016-06-29 MED ORDER — ACETAMINOPHEN 10 MG/ML IV SOLN
1000.0000 mg | Freq: Once | INTRAVENOUS | Status: AC
  Administered 2016-06-29: 1000 mg via INTRAVENOUS

## 2016-06-29 MED ORDER — ACETAMINOPHEN 10 MG/ML IV SOLN
INTRAVENOUS | Status: AC
  Filled 2016-06-29: qty 100

## 2016-06-29 MED ORDER — PROPOFOL 10 MG/ML IV BOLUS
INTRAVENOUS | Status: AC
  Filled 2016-06-29: qty 20

## 2016-06-29 MED ORDER — DEXAMETHASONE SODIUM PHOSPHATE 10 MG/ML IJ SOLN
INTRAMUSCULAR | Status: AC
  Filled 2016-06-29: qty 1

## 2016-06-29 MED ORDER — LACTATED RINGERS IV SOLN
INTRAVENOUS | Status: DC | PRN
  Administered 2016-06-29: 08:00:00 via INTRAVENOUS

## 2016-06-29 MED ORDER — HYDROCODONE-ACETAMINOPHEN 7.5-325 MG/15ML PO SOLN: 15.0000 mL | Freq: Four times a day (QID) | ORAL | 0 refills | Status: AC | PRN

## 2016-06-29 MED ORDER — CHLORHEXIDINE GLUCONATE CLOTH 2 % EX PADS: 6.0000 | MEDICATED_PAD | Freq: Once | CUTANEOUS | Status: DC

## 2016-06-29 MED ORDER — ONDANSETRON HCL 4 MG/2ML IJ SOLN: 4.0000 mg | Freq: Once | INTRAMUSCULAR | Status: DC | PRN

## 2016-06-29 MED ORDER — PHENYLEPHRINE HCL 10 MG/ML IJ SOLN
INTRAMUSCULAR | Status: DC | PRN
  Administered 2016-06-29 (×3): 80 ug via INTRAVENOUS

## 2016-06-29 MED ORDER — FENTANYL CITRATE (PF) 100 MCG/2ML IJ SOLN: 25.0000 ug | INTRAMUSCULAR | Status: DC | PRN

## 2016-06-29 MED ORDER — LIDOCAINE 2% (20 MG/ML) 5 ML SYRINGE
INTRAMUSCULAR | Status: DC | PRN
  Administered 2016-06-29: 40 mg via INTRAVENOUS

## 2016-06-29 MED ORDER — EPHEDRINE 5 MG/ML INJ
INTRAVENOUS | Status: AC
  Filled 2016-06-29: qty 10

## 2016-06-29 MED ORDER — EPHEDRINE SULFATE 50 MG/ML IJ SOLN
INTRAMUSCULAR | Status: DC | PRN
  Administered 2016-06-29 (×3): 10 mg via INTRAVENOUS

## 2016-06-29 MED ORDER — ROCURONIUM BROMIDE 50 MG/5ML IV SOSY
PREFILLED_SYRINGE | INTRAVENOUS | Status: AC
  Filled 2016-06-29: qty 5

## 2016-06-29 MED ORDER — FENTANYL CITRATE (PF) 100 MCG/2ML IJ SOLN
INTRAMUSCULAR | Status: DC | PRN
  Administered 2016-06-29: 100 ug via INTRAVENOUS
  Administered 2016-06-29: 50 ug via INTRAVENOUS

## 2016-06-29 MED ORDER — LIP MEDEX EX OINT
TOPICAL_OINTMENT | CUTANEOUS | Status: AC
  Filled 2016-06-29: qty 7

## 2016-06-29 MED ORDER — BUPIVACAINE HCL 0.25 % IJ SOLN
INTRAMUSCULAR | Status: DC | PRN
  Administered 2016-06-29: 9 mL

## 2016-06-29 MED ORDER — SUGAMMADEX SODIUM 200 MG/2ML IV SOLN
INTRAVENOUS | Status: AC
  Filled 2016-06-29: qty 2

## 2016-06-29 MED ORDER — FENTANYL CITRATE (PF) 250 MCG/5ML IJ SOLN
INTRAMUSCULAR | Status: AC
  Filled 2016-06-29: qty 5

## 2016-06-29 MED ORDER — PROPOFOL 10 MG/ML IV BOLUS
INTRAVENOUS | Status: DC | PRN
  Administered 2016-06-29: 100 mg via INTRAVENOUS

## 2016-06-29 MED ORDER — MIDAZOLAM HCL 2 MG/2ML IJ SOLN
INTRAMUSCULAR | Status: AC
  Filled 2016-06-29: qty 2

## 2016-06-29 MED ORDER — ONDANSETRON HCL 4 MG/2ML IJ SOLN
INTRAMUSCULAR | Status: AC
  Filled 2016-06-29: qty 2

## 2016-06-29 MED ORDER — CEFAZOLIN SODIUM-DEXTROSE 2-4 GM/100ML-% IV SOLN
2.0000 g | INTRAVENOUS | Status: AC
  Administered 2016-06-29: 2 g via INTRAVENOUS

## 2016-06-29 MED ORDER — BUPIVACAINE HCL (PF) 0.25 % IJ SOLN
INTRAMUSCULAR | Status: AC
  Filled 2016-06-29: qty 30

## 2016-06-29 MED ORDER — CEFAZOLIN SODIUM-DEXTROSE 2-4 GM/100ML-% IV SOLN
INTRAVENOUS | Status: AC
  Filled 2016-06-29: qty 100

## 2016-06-29 MED ORDER — OXYCODONE HCL 5 MG PO TABS: 5.0000 mg | ORAL_TABLET | Freq: Once | ORAL | Status: DC | PRN

## 2016-06-29 MED ORDER — OXYCODONE HCL 5 MG/5ML PO SOLN
5.0000 mg | Freq: Once | ORAL | Status: DC | PRN
  Filled 2016-06-29: qty 5

## 2016-06-29 MED ORDER — PHENYLEPHRINE 40 MCG/ML (10ML) SYRINGE FOR IV PUSH (FOR BLOOD PRESSURE SUPPORT)
PREFILLED_SYRINGE | INTRAVENOUS | Status: AC
  Filled 2016-06-29: qty 10

## 2016-06-29 MED ORDER — SUGAMMADEX SODIUM 200 MG/2ML IV SOLN
INTRAVENOUS | Status: DC | PRN
  Administered 2016-06-29: 150 mg via INTRAVENOUS

## 2016-06-29 SURGICAL SUPPLY — 37 items
ATTRACTOMAT 16X20 MAGNETIC DRP (DRAPES) ×3 IMPLANT
BLADE HEX COATED 2.75 (ELECTRODE) ×3 IMPLANT
BLADE SURG 15 STRL LF DISP TIS (BLADE) ×1 IMPLANT
BLADE SURG 15 STRL SS (BLADE) ×2
CHLORAPREP W/TINT 26ML (MISCELLANEOUS) ×6 IMPLANT
CLIP TI MEDIUM 6 (CLIP) ×3 IMPLANT
CLIP TI WIDE RED SMALL 6 (CLIP) ×6 IMPLANT
CLOSURE WOUND 1/2 X4 (GAUZE/BANDAGES/DRESSINGS) ×1
DERMABOND ADVANCED (GAUZE/BANDAGES/DRESSINGS)
DERMABOND ADVANCED .7 DNX12 (GAUZE/BANDAGES/DRESSINGS) IMPLANT
DRAPE LAPAROTOMY T 98X78 PEDS (DRAPES) ×3 IMPLANT
ELECT PENCIL ROCKER SW 15FT (MISCELLANEOUS) ×3 IMPLANT
ELECT REM PT RETURN 15FT ADLT (MISCELLANEOUS) ×3 IMPLANT
GAUZE SPONGE 4X4 12PLY STRL (GAUZE/BANDAGES/DRESSINGS) ×3 IMPLANT
GAUZE SPONGE 4X4 16PLY XRAY LF (GAUZE/BANDAGES/DRESSINGS) ×3 IMPLANT
GLOVE SURG ORTHO 8.0 STRL STRW (GLOVE) ×3 IMPLANT
GOWN STRL REUS W/TWL XL LVL3 (GOWN DISPOSABLE) ×9 IMPLANT
HEMOSTAT SURGICEL 2X4 FIBR (HEMOSTASIS) ×3 IMPLANT
ILLUMINATOR WAVEGUIDE N/F (MISCELLANEOUS) IMPLANT
KIT BASIN OR (CUSTOM PROCEDURE TRAY) ×3 IMPLANT
LIGHT WAVEGUIDE WIDE FLAT (MISCELLANEOUS) IMPLANT
NEEDLE HYPO 25X1 1.5 SAFETY (NEEDLE) ×3 IMPLANT
PACK BASIC VI WITH GOWN DISP (CUSTOM PROCEDURE TRAY) ×3 IMPLANT
STAPLER VISISTAT 35W (STAPLE) ×3 IMPLANT
STRIP CLOSURE SKIN 1/2X4 (GAUZE/BANDAGES/DRESSINGS) ×2 IMPLANT
SUT MNCRL AB 4-0 PS2 18 (SUTURE) ×3 IMPLANT
SUT SILK 2 0 (SUTURE)
SUT SILK 2-0 18XBRD TIE 12 (SUTURE) IMPLANT
SUT SILK 3 0 (SUTURE)
SUT SILK 3-0 18XBRD TIE 12 (SUTURE) IMPLANT
SUT VIC AB 3-0 SH 18 (SUTURE) ×6 IMPLANT
SYR BULB IRRIGATION 50ML (SYRINGE) ×3 IMPLANT
SYR CONTROL 10ML LL (SYRINGE) ×3 IMPLANT
TAPE CLOTH SURG 4X10 WHT LF (GAUZE/BANDAGES/DRESSINGS) ×3 IMPLANT
TOWEL OR 17X26 10 PK STRL BLUE (TOWEL DISPOSABLE) ×3 IMPLANT
TOWEL OR NON WOVEN STRL DISP B (DISPOSABLE) ×3 IMPLANT
YANKAUER SUCT BULB TIP 10FT TU (MISCELLANEOUS) ×3 IMPLANT

## 2016-06-29 NOTE — Anesthesia Procedure Notes (Signed)
Procedure Name: Intubation Date/Time: 06/29/2016 8:32 AM Performed by: Maxwell Caul Pre-anesthesia Checklist: Patient identified, Emergency Drugs available, Suction available and Patient being monitored Patient Re-evaluated:Patient Re-evaluated prior to inductionOxygen Delivery Method: Circle system utilized Preoxygenation: Pre-oxygenation with 100% oxygen Intubation Type: IV induction Ventilation: Mask ventilation without difficulty and Oral airway inserted - appropriate to patient size Laryngoscope Size: Mac and 4 Grade View: Grade I Tube type: Oral Tube size: 7.5 mm Number of attempts: 1 Airway Equipment and Method: Stylet and Oral airway Placement Confirmation: ETT inserted through vocal cords under direct vision,  positive ETCO2 and breath sounds checked- equal and bilateral Secured at: 21 cm Tube secured with: Tape Dental Injury: Teeth and Oropharynx as per pre-operative assessment

## 2016-06-29 NOTE — H&P (View-Only) (Signed)
General Surgery [facility]  Travis Phillips DOB: 04-13-1955 Divorced / Language: Travis Phillips / Race: White Male  History of Present Illness  The patient is a 61 year old male who presents with a parathyroid neoplasm.  Patient is referred by Dr. Renato Shin for evaluation and management of suspected primary hyperparathyroidism. Patient was originally diagnosed with hypercalcemia in 2011. Patient has a complex past medical history. He recently was admitted in [place] with urinary tract infection and obstruction in the left ureter from nephrolithiasis. Laboratory studies from October 2017 show an elevated calcium level of 11.4 and an elevated intact PTH level of 113. Vitamin D level is slightly low at 24.49. Patient has had 24-hour urine collections but those results are not available to me today. Patient has not had any imaging studies. He presents today accompanied by family members for evaluation. Patient has significant hearing loss. He has had previous cervical spine surgery at [facility] from a posterior approach. There is no family history of other endocrine neoplasm or endocrine surgery.   Past Surgical History  Spinal Surgery - Neck   Allergies  Lipitor *ANTIHYPERLIPIDEMICS*  Rash.  Medication History Elavil (10MG  Tablet, Oral three times daily) Active. Aspirin (81MG  Tablet, Oral daily) Active. Baclofen (10MG  Tablet, Oral daily) Active. Vitamin D (Cholecalciferol) (1000UNIT Tablet, Oral daily) Active. Fenofibrate (160MG  Tablet, Oral daily) Active. Lopressor (50MG  Tablet, Oral daily) Active. Actos (15MG  Tablet, Oral daily) Active. Zocor (10MG  Tablet, Oral daily) Active. Flomax (0.4MG  Capsule, Oral daily) Active. Medications Reconciled  Social History Alcohol use  Remotely quit alcohol use. Caffeine use  Coffee, Tea. Illicit drug use  Uses socially only. Tobacco use  Former smoker.  Family History   Arthritis  Father. Hypertension  Mother.  Other Problems  Diabetes Mellitus    Review of Systems Breast Not Present- Breast Mass, Breast Pain, Nipple Discharge and Skin Changes. Cardiovascular Not Present- Chest Pain, Difficulty Breathing Lying Down, Leg Cramps, Palpitations, Rapid Heart Rate, Shortness of Breath and Swelling of Extremities. Gastrointestinal Not Present- Abdominal Pain, Bloating, Bloody Stool, Change in Bowel Habits, Chronic diarrhea, Constipation, Difficulty Swallowing, Excessive gas, Gets full quickly at meals, Hemorrhoids, Indigestion, Nausea, Rectal Pain and Vomiting.  Vitals Weight: 170 lb Height: 72in Body Surface Area: 1.99 m Body Mass Index: 23.06 kg/m  Temp.: 98.74F  Pulse: 86 (Regular)  BP: 102/62 (Sitting, Left Arm, Standard)   Physical Exam  The physical exam findings are as follows: Note:General - appears comfortable, no distress; not diaphorectic; seated in a wheelchair  HEENT - normocephalic; sclerae clear, gaze conjugate; mucous membranes moist, dentition fair; voice slow  Neck - symmetric on extension; no palpable anterior or posterior cervical adenopathy; no palpable masses in the thyroid bed  Chest - clear bilaterally without rhonchi, rales, or wheeze  Cor - regular rhythm with normal rate; no significant murmur  Ext - non-tender without significant edema or lymphedema  Neuro - grossly intact; moderate coarse tremor    Assessment & Plan   PRIMARY HYPERPARATHYROIDISM (E21.0)  Follow Up - Call [facility] office after tests / studies doneto discuss further plans  Patient presents on referral from his endocrinologist for evaluation of hypercalcemia for suspected primary hyperparathyroidism. Patient and his family are provided with written literature on parathyroid surgery to review at home.  After review of his records and discussion with the patient and his family, I have recommended proceeding with nuclear medicine  parathyroid scan. Hopefully this will confirm the diagnosis and localize a parathyroid  adenoma. If so, I believe he will be a good candidate for minimally invasive surgery as an outpatient procedure. If the sestamibi scan fails to localize an adenoma, then we will plan to obtain a high-resolution ultrasound examination of the neck. If this is unrevealing, then we would consider a 4D CT scan of the neck. All of this is in hopes of confirming the diagnosis and performing a minimally invasive procedure.  Patient will undergo nuclear medicine parathyroid scan. We will contact them with the results as soon as they are available.  Pt Education - Pamphlet Given - The Parathyroid Surgery Book: discussed with patient and provided information.  ADDENDUM: Patient underwent nuclear medicine parathyroid scan.  This localized an adenoma to the left inferior position.  Minimally invasive surgery was recommended.  The risks and benefits of the procedure have been discussed at length with the patient.  The patient understands the proposed procedure, potential alternative treatments, and the course of recovery to be expected.  All of the patient's questions have been answered at this time.  The patient wishes to proceed with surgery.  Surgery was delayed due to recent [place] of state with urosepsis.  Earnstine Regal, MD, [facility] Office: [phone]

## 2016-06-29 NOTE — Interval H&P Note (Signed)
History and Physical Interval Note:  06/29/2016 8:10 AM  Travis Phillips  has presented today for surgery, with the diagnosis of primary hyperparathyroidism.  The various methods of treatment have been discussed with the patient and family. After consideration of risks, benefits and other options for treatment, the patient has consented to    Procedure(s): LEFT INFERIOR PARATHYROIDECTOMY (Left) as a surgical intervention .    The patient's history has been reviewed, patient examined, no change in status, stable for surgery.  I have reviewed the patient's chart and labs.  Questions were answered to the patient's satisfaction.    Earnstine Regal, MD, [facility] Office: [place]

## 2016-06-29 NOTE — Op Note (Signed)
OPERATIVE REPORT - PARATHYROIDECTOMY  Preoperative diagnosis: Primary hyperparathyroidism  Postop diagnosis: Same  Procedure: Left inferior minimally invasive parathyroidectomy  Surgeon:  Earnstine Regal, MD, FACS  Anesthesia: Gen. endotracheal  Estimated blood loss: Minimal  Preparation: ChloraPrep  Indications: The patient is a 61 year old male who presents with a parathyroid neoplasm.  Patient is referred by Dr. Renato Shin for evaluation and management of suspected primary hyperparathyroidism. Patient was originally diagnosed with hypercalcemia in 2011. Patient has a complex past medical history. He recently was admitted in [place] with urinary tract infection and obstruction in the left ureter from nephrolithiasis. Laboratory studies from October 2017 show an elevated calcium level of 11.4 and an elevated intact PTH level of 113. Vitamin D level is slightly low at 24.49. Nuclear med parathyroid scan localized an adenoma to the left inferior position.  Procedure: Patient was prepared in the holding area. He was brought to operating room and placed in a supine position on the operating room table. Following administration of general anesthesia, the patient was positioned and then prepped and draped in the usual strict aseptic fashion. After ascertaining that an adequate level of anesthesia been achieved, a neck incision was made with a #15 blade. Dissection was carried through subcutaneous tissues and platysma. Hemostasis was obtained with the electrocautery. Skin flaps were developed circumferentially and a Weitlander retractor was placed for exposure.  Strap muscles were incised in the midline. Strap muscles were reflected exposing the thyroid lobe. With gentle blunt dissection the thyroid lobe was mobilized.  Dissection was carried through adipose tissue and an enlarged parathyroid gland was identified. It was gently mobilized. Vascular structures were divided between small  and medium ligaclips. Care was taken to avoid the recurrent laryngeal nerve and the esophagus. The parathyroid gland was completely excised. It was submitted to pathology where frozen section confirmed parathyroid tissue consistent with adenoma.  Neck was irrigated with warm saline and good hemostasis was noted. Fibrillar was placed in the operative field. Strap muscles were reapproximated in the midline with interrupted 3-0 Vicryl sutures. Platysma was closed with interrupted 3-0 Vicryl sutures. Skin was closed with a running 4-0 Monocryl subcuticular suture. Marcaine was infiltrated circumferentially. Wound was washed and dried and Dermabond was applied. Patient was awakened from anesthesia and brought to the recovery room. The patient tolerated the procedure well.   Earnstine Regal, MD, [facility]

## 2016-06-29 NOTE — Anesthesia Preprocedure Evaluation (Addendum)
Anesthesia Evaluation  Patient identified by MRN, date of birth, ID band Patient confused    Reviewed: Allergy & Precautions, NPO status , Patient's Chart, lab work & pertinent test results  Airway Mallampati: III  TM Distance: >3 FB Neck ROM: Full    Dental  (+) Teeth Intact   Pulmonary former smoker,    breath sounds clear to auscultation       Cardiovascular hypertension,  Rhythm:Regular Rate:Normal     Neuro/Psych    GI/Hepatic   Endo/Other  diabetes  Renal/GU      Musculoskeletal   Abdominal   Peds  Hematology   Anesthesia Other Findings Patient confused, severe hearing deficit, not able to follow commands, moving all extremities  Reproductive/Obstetrics                            Anesthesia Physical Anesthesia Plan  ASA: III  Anesthesia Plan: General   Post-op Pain Management:    Induction: Intravenous  Airway Management Planned: Oral ETT  Additional Equipment:   Intra-op Plan:   Post-operative Plan: Extubation in OR  Informed Consent: I have reviewed the patients History and Physical, chart, labs and discussed the procedure including the risks, benefits and alternatives for the proposed anesthesia with the patient or authorized representative who has indicated his/her understanding and acceptance.   Dental advisory given  Plan Discussed with: CRNA and Anesthesiologist  Anesthesia Plan Comments:         Anesthesia Quick Evaluation

## 2016-06-29 NOTE — Transfer of Care (Signed)
Immediate Anesthesia Transfer of Care Note  Patient: Travis Phillips  Procedure(s) Performed: Procedure(s): LEFT INFERIOR PARATHYROIDECTOMY (Left)  Patient Location: PACU  Anesthesia Type:General  Level of Consciousness:  sedated, patient cooperative and responds to stimulation  Airway & Oxygen Therapy:Patient Spontanous Breathing and Patient connected to face mask oxgen  Post-op Assessment:  Report given to PACU RN and Post -op Vital signs reviewed and stable  Post vital signs:  Reviewed and stable  Last Vitals:  Vitals:   06/29/16 0624  BP: 110/70  Pulse: 94  Resp: 18  Temp: 13.0 C    Complications: No apparent anesthesia complications

## 2016-06-29 NOTE — Anesthesia Postprocedure Evaluation (Addendum)
Anesthesia Post Note  Patient: Travis Phillips  Procedure(s) Performed: Procedure(s) (LRB): LEFT INFERIOR PARATHYROIDECTOMY (Left)  Patient location during evaluation: PACU Anesthesia Type: General Level of consciousness: awake, awake and alert and oriented Pain management: pain level controlled Vital Signs Assessment: post-procedure vital signs reviewed and stable Respiratory status: spontaneous breathing, nonlabored ventilation and respiratory function stable Cardiovascular status: blood pressure returned to baseline Anesthetic complications: no       Last Vitals:  Vitals:   06/29/16 1109 06/29/16 1333  BP: 122/77 112/70  Pulse: (!) 103 (!) 105  Resp: 16 18  Temp:      Last Pain:  Vitals:   06/29/16 1333  TempSrc:   PainSc: 2                  Meerab Maselli COKER

## 2016-06-30 ENCOUNTER — Encounter (HOSPITAL_COMMUNITY): Payer: Self-pay | Admitting: Surgery

## 2016-07-03 DIAGNOSIS — R1312 Dysphagia, oropharyngeal phase: Secondary | ICD-10-CM | POA: Diagnosis not present

## 2016-07-03 DIAGNOSIS — A419 Sepsis, unspecified organism: Secondary | ICD-10-CM | POA: Diagnosis not present

## 2016-07-03 DIAGNOSIS — M6281 Muscle weakness (generalized): Secondary | ICD-10-CM | POA: Diagnosis not present

## 2016-07-05 DIAGNOSIS — A499 Bacterial infection, unspecified: Secondary | ICD-10-CM | POA: Diagnosis not present

## 2016-07-05 DIAGNOSIS — M6281 Muscle weakness (generalized): Secondary | ICD-10-CM | POA: Diagnosis not present

## 2016-07-05 DIAGNOSIS — I10 Essential (primary) hypertension: Secondary | ICD-10-CM | POA: Diagnosis not present

## 2016-07-05 DIAGNOSIS — R1312 Dysphagia, oropharyngeal phase: Secondary | ICD-10-CM | POA: Diagnosis not present

## 2016-07-05 DIAGNOSIS — E119 Type 2 diabetes mellitus without complications: Secondary | ICD-10-CM | POA: Diagnosis not present

## 2016-07-05 DIAGNOSIS — D649 Anemia, unspecified: Secondary | ICD-10-CM | POA: Diagnosis not present

## 2016-07-05 DIAGNOSIS — A419 Sepsis, unspecified organism: Secondary | ICD-10-CM | POA: Diagnosis not present

## 2016-07-06 DIAGNOSIS — A419 Sepsis, unspecified organism: Secondary | ICD-10-CM | POA: Diagnosis not present

## 2016-07-06 DIAGNOSIS — R1312 Dysphagia, oropharyngeal phase: Secondary | ICD-10-CM | POA: Diagnosis not present

## 2016-07-06 DIAGNOSIS — M6281 Muscle weakness (generalized): Secondary | ICD-10-CM | POA: Diagnosis not present

## 2016-07-07 DIAGNOSIS — A419 Sepsis, unspecified organism: Secondary | ICD-10-CM | POA: Diagnosis not present

## 2016-07-07 DIAGNOSIS — M6281 Muscle weakness (generalized): Secondary | ICD-10-CM | POA: Diagnosis not present

## 2016-07-07 DIAGNOSIS — R1312 Dysphagia, oropharyngeal phase: Secondary | ICD-10-CM | POA: Diagnosis not present

## 2016-07-09 DIAGNOSIS — R140 Abdominal distension (gaseous): Secondary | ICD-10-CM | POA: Diagnosis not present

## 2016-07-10 DIAGNOSIS — N390 Urinary tract infection, site not specified: Secondary | ICD-10-CM | POA: Diagnosis not present

## 2016-07-10 DIAGNOSIS — K560 Paralytic ileus: Secondary | ICD-10-CM | POA: Diagnosis not present

## 2016-07-10 DIAGNOSIS — J9601 Acute respiratory failure with hypoxia: Secondary | ICD-10-CM | POA: Diagnosis not present

## 2016-07-10 DIAGNOSIS — E870 Hyperosmolality and hypernatremia: Secondary | ICD-10-CM | POA: Diagnosis not present

## 2016-07-10 DIAGNOSIS — M6281 Muscle weakness (generalized): Secondary | ICD-10-CM | POA: Diagnosis not present

## 2016-07-10 DIAGNOSIS — R1312 Dysphagia, oropharyngeal phase: Secondary | ICD-10-CM | POA: Diagnosis not present

## 2016-07-10 DIAGNOSIS — E119 Type 2 diabetes mellitus without complications: Secondary | ICD-10-CM | POA: Diagnosis not present

## 2016-07-10 DIAGNOSIS — Z681 Body mass index (BMI) 19 or less, adult: Secondary | ICD-10-CM | POA: Diagnosis not present

## 2016-07-10 DIAGNOSIS — E440 Moderate protein-calorie malnutrition: Secondary | ICD-10-CM | POA: Diagnosis not present

## 2016-07-10 DIAGNOSIS — K567 Ileus, unspecified: Secondary | ICD-10-CM | POA: Diagnosis not present

## 2016-07-10 DIAGNOSIS — J181 Lobar pneumonia, unspecified organism: Secondary | ICD-10-CM | POA: Diagnosis not present

## 2016-07-10 DIAGNOSIS — K219 Gastro-esophageal reflux disease without esophagitis: Secondary | ICD-10-CM | POA: Diagnosis not present

## 2016-07-10 DIAGNOSIS — I10 Essential (primary) hypertension: Secondary | ICD-10-CM | POA: Diagnosis not present

## 2016-07-10 DIAGNOSIS — R569 Unspecified convulsions: Secondary | ICD-10-CM | POA: Diagnosis not present

## 2016-07-10 DIAGNOSIS — D72829 Elevated white blood cell count, unspecified: Secondary | ICD-10-CM | POA: Diagnosis not present

## 2016-07-10 DIAGNOSIS — N401 Enlarged prostate with lower urinary tract symptoms: Secondary | ICD-10-CM | POA: Diagnosis not present

## 2016-07-10 DIAGNOSIS — E785 Hyperlipidemia, unspecified: Secondary | ICD-10-CM | POA: Diagnosis not present

## 2016-07-10 DIAGNOSIS — J690 Pneumonitis due to inhalation of food and vomit: Secondary | ICD-10-CM | POA: Diagnosis not present

## 2016-07-10 DIAGNOSIS — A419 Sepsis, unspecified organism: Secondary | ICD-10-CM | POA: Diagnosis not present

## 2016-07-10 DIAGNOSIS — R64 Cachexia: Secondary | ICD-10-CM | POA: Diagnosis not present

## 2016-07-10 DIAGNOSIS — R140 Abdominal distension (gaseous): Secondary | ICD-10-CM | POA: Diagnosis not present

## 2016-07-10 DIAGNOSIS — R112 Nausea with vomiting, unspecified: Secondary | ICD-10-CM | POA: Diagnosis not present

## 2016-07-18 DIAGNOSIS — K219 Gastro-esophageal reflux disease without esophagitis: Secondary | ICD-10-CM | POA: Diagnosis not present

## 2016-07-18 DIAGNOSIS — R532 Functional quadriplegia: Secondary | ICD-10-CM | POA: Diagnosis not present

## 2016-07-18 DIAGNOSIS — K56609 Unspecified intestinal obstruction, unspecified as to partial versus complete obstruction: Secondary | ICD-10-CM | POA: Diagnosis not present

## 2016-07-18 DIAGNOSIS — N390 Urinary tract infection, site not specified: Secondary | ICD-10-CM | POA: Diagnosis not present

## 2016-07-18 DIAGNOSIS — A419 Sepsis, unspecified organism: Secondary | ICD-10-CM | POA: Diagnosis not present

## 2016-07-18 DIAGNOSIS — M6281 Muscle weakness (generalized): Secondary | ICD-10-CM | POA: Diagnosis not present

## 2016-07-18 DIAGNOSIS — N401 Enlarged prostate with lower urinary tract symptoms: Secondary | ICD-10-CM | POA: Diagnosis not present

## 2016-07-18 DIAGNOSIS — K567 Ileus, unspecified: Secondary | ICD-10-CM | POA: Diagnosis not present

## 2016-07-18 DIAGNOSIS — R1312 Dysphagia, oropharyngeal phase: Secondary | ICD-10-CM | POA: Diagnosis not present

## 2016-07-18 DIAGNOSIS — R140 Abdominal distension (gaseous): Secondary | ICD-10-CM | POA: Diagnosis not present

## 2016-07-18 DIAGNOSIS — I10 Essential (primary) hypertension: Secondary | ICD-10-CM | POA: Diagnosis not present

## 2016-07-18 DIAGNOSIS — E861 Hypovolemia: Secondary | ICD-10-CM | POA: Diagnosis not present

## 2016-07-18 DIAGNOSIS — E785 Hyperlipidemia, unspecified: Secondary | ICD-10-CM | POA: Diagnosis not present

## 2016-07-18 DIAGNOSIS — Z66 Do not resuscitate: Secondary | ICD-10-CM | POA: Diagnosis not present

## 2016-07-18 DIAGNOSIS — M868X8 Other osteomyelitis, other site: Secondary | ICD-10-CM | POA: Diagnosis not present

## 2016-07-18 DIAGNOSIS — R000 Tachycardia, unspecified: Secondary | ICD-10-CM | POA: Diagnosis not present

## 2016-07-18 DIAGNOSIS — E440 Moderate protein-calorie malnutrition: Secondary | ICD-10-CM | POA: Diagnosis not present

## 2016-07-18 DIAGNOSIS — E119 Type 2 diabetes mellitus without complications: Secondary | ICD-10-CM | POA: Diagnosis not present

## 2016-07-18 DIAGNOSIS — K560 Paralytic ileus: Secondary | ICD-10-CM | POA: Diagnosis not present

## 2016-07-18 DIAGNOSIS — R569 Unspecified convulsions: Secondary | ICD-10-CM | POA: Diagnosis not present

## 2016-07-20 DIAGNOSIS — R569 Unspecified convulsions: Secondary | ICD-10-CM | POA: Diagnosis not present

## 2016-07-20 DIAGNOSIS — E119 Type 2 diabetes mellitus without complications: Secondary | ICD-10-CM | POA: Diagnosis not present

## 2016-07-20 DIAGNOSIS — K56609 Unspecified intestinal obstruction, unspecified as to partial versus complete obstruction: Secondary | ICD-10-CM | POA: Diagnosis not present

## 2016-07-20 DIAGNOSIS — M868X8 Other osteomyelitis, other site: Secondary | ICD-10-CM | POA: Diagnosis not present

## 2016-07-20 DIAGNOSIS — R000 Tachycardia, unspecified: Secondary | ICD-10-CM | POA: Diagnosis not present

## 2016-07-20 DIAGNOSIS — R1312 Dysphagia, oropharyngeal phase: Secondary | ICD-10-CM | POA: Diagnosis not present

## 2016-07-20 DIAGNOSIS — Z66 Do not resuscitate: Secondary | ICD-10-CM | POA: Diagnosis not present

## 2016-07-20 DIAGNOSIS — K219 Gastro-esophageal reflux disease without esophagitis: Secondary | ICD-10-CM | POA: Diagnosis not present

## 2016-07-20 DIAGNOSIS — N401 Enlarged prostate with lower urinary tract symptoms: Secondary | ICD-10-CM | POA: Diagnosis not present

## 2016-07-20 DIAGNOSIS — A419 Sepsis, unspecified organism: Secondary | ICD-10-CM | POA: Diagnosis not present

## 2016-07-20 DIAGNOSIS — R532 Functional quadriplegia: Secondary | ICD-10-CM | POA: Diagnosis not present

## 2016-07-20 DIAGNOSIS — R140 Abdominal distension (gaseous): Secondary | ICD-10-CM | POA: Diagnosis not present

## 2016-07-20 DIAGNOSIS — N390 Urinary tract infection, site not specified: Secondary | ICD-10-CM | POA: Diagnosis not present

## 2016-07-20 DIAGNOSIS — E861 Hypovolemia: Secondary | ICD-10-CM | POA: Diagnosis not present

## 2016-07-20 DIAGNOSIS — E440 Moderate protein-calorie malnutrition: Secondary | ICD-10-CM | POA: Diagnosis not present

## 2016-07-20 DIAGNOSIS — M6281 Muscle weakness (generalized): Secondary | ICD-10-CM | POA: Diagnosis not present

## 2016-07-20 DIAGNOSIS — K560 Paralytic ileus: Secondary | ICD-10-CM | POA: Diagnosis not present

## 2016-07-20 DIAGNOSIS — K567 Ileus, unspecified: Secondary | ICD-10-CM | POA: Diagnosis not present

## 2016-07-20 DIAGNOSIS — I10 Essential (primary) hypertension: Secondary | ICD-10-CM | POA: Diagnosis not present

## 2016-07-20 DIAGNOSIS — E785 Hyperlipidemia, unspecified: Secondary | ICD-10-CM | POA: Diagnosis not present

## 2016-07-24 DIAGNOSIS — R269 Unspecified abnormalities of gait and mobility: Secondary | ICD-10-CM | POA: Diagnosis not present

## 2016-07-24 DIAGNOSIS — R569 Unspecified convulsions: Secondary | ICD-10-CM | POA: Diagnosis not present

## 2016-07-24 DIAGNOSIS — R918 Other nonspecific abnormal finding of lung field: Secondary | ICD-10-CM | POA: Diagnosis not present

## 2016-07-24 DIAGNOSIS — R188 Other ascites: Secondary | ICD-10-CM | POA: Diagnosis not present

## 2016-07-24 DIAGNOSIS — N400 Enlarged prostate without lower urinary tract symptoms: Secondary | ICD-10-CM | POA: Diagnosis not present

## 2016-07-24 DIAGNOSIS — R140 Abdominal distension (gaseous): Secondary | ICD-10-CM | POA: Diagnosis not present

## 2016-07-24 DIAGNOSIS — I10 Essential (primary) hypertension: Secondary | ICD-10-CM | POA: Diagnosis not present

## 2016-07-24 DIAGNOSIS — H9190 Unspecified hearing loss, unspecified ear: Secondary | ICD-10-CM | POA: Diagnosis not present

## 2016-07-24 DIAGNOSIS — K828 Other specified diseases of gallbladder: Secondary | ICD-10-CM | POA: Diagnosis not present

## 2016-07-24 DIAGNOSIS — K56609 Unspecified intestinal obstruction, unspecified as to partial versus complete obstruction: Secondary | ICD-10-CM | POA: Diagnosis not present

## 2016-07-24 DIAGNOSIS — K567 Ileus, unspecified: Secondary | ICD-10-CM | POA: Diagnosis not present

## 2016-07-24 DIAGNOSIS — Z7982 Long term (current) use of aspirin: Secondary | ICD-10-CM | POA: Diagnosis not present

## 2016-07-24 DIAGNOSIS — N390 Urinary tract infection, site not specified: Secondary | ICD-10-CM | POA: Diagnosis not present

## 2016-07-24 DIAGNOSIS — R109 Unspecified abdominal pain: Secondary | ICD-10-CM | POA: Diagnosis not present

## 2016-07-24 DIAGNOSIS — K219 Gastro-esophageal reflux disease without esophagitis: Secondary | ICD-10-CM | POA: Diagnosis not present

## 2016-07-24 DIAGNOSIS — Q438 Other specified congenital malformations of intestine: Secondary | ICD-10-CM | POA: Diagnosis not present

## 2016-07-24 DIAGNOSIS — K6389 Other specified diseases of intestine: Secondary | ICD-10-CM | POA: Diagnosis not present

## 2016-07-24 DIAGNOSIS — N401 Enlarged prostate with lower urinary tract symptoms: Secondary | ICD-10-CM | POA: Diagnosis not present

## 2016-07-24 DIAGNOSIS — Z66 Do not resuscitate: Secondary | ICD-10-CM | POA: Diagnosis not present

## 2016-07-24 DIAGNOSIS — R1312 Dysphagia, oropharyngeal phase: Secondary | ICD-10-CM | POA: Diagnosis not present

## 2016-07-24 DIAGNOSIS — R079 Chest pain, unspecified: Secondary | ICD-10-CM | POA: Diagnosis not present

## 2016-07-24 DIAGNOSIS — M6281 Muscle weakness (generalized): Secondary | ICD-10-CM | POA: Diagnosis not present

## 2016-07-24 DIAGNOSIS — E119 Type 2 diabetes mellitus without complications: Secondary | ICD-10-CM | POA: Diagnosis not present

## 2016-07-24 DIAGNOSIS — E785 Hyperlipidemia, unspecified: Secondary | ICD-10-CM | POA: Diagnosis not present

## 2016-07-26 DIAGNOSIS — K567 Ileus, unspecified: Secondary | ICD-10-CM | POA: Diagnosis not present

## 2016-07-26 DIAGNOSIS — R269 Unspecified abnormalities of gait and mobility: Secondary | ICD-10-CM | POA: Diagnosis not present

## 2016-07-27 DIAGNOSIS — K567 Ileus, unspecified: Secondary | ICD-10-CM | POA: Diagnosis not present

## 2016-07-27 DIAGNOSIS — K219 Gastro-esophageal reflux disease without esophagitis: Secondary | ICD-10-CM | POA: Diagnosis not present

## 2016-07-27 DIAGNOSIS — R079 Chest pain, unspecified: Secondary | ICD-10-CM | POA: Diagnosis not present

## 2016-07-27 DIAGNOSIS — H9190 Unspecified hearing loss, unspecified ear: Secondary | ICD-10-CM | POA: Diagnosis not present

## 2016-07-27 DIAGNOSIS — R188 Other ascites: Secondary | ICD-10-CM | POA: Diagnosis not present

## 2016-07-27 DIAGNOSIS — K828 Other specified diseases of gallbladder: Secondary | ICD-10-CM | POA: Diagnosis not present

## 2016-07-27 DIAGNOSIS — I10 Essential (primary) hypertension: Secondary | ICD-10-CM | POA: Diagnosis not present

## 2016-07-27 DIAGNOSIS — R918 Other nonspecific abnormal finding of lung field: Secondary | ICD-10-CM | POA: Diagnosis not present

## 2016-07-27 DIAGNOSIS — N400 Enlarged prostate without lower urinary tract symptoms: Secondary | ICD-10-CM | POA: Diagnosis not present

## 2016-07-27 DIAGNOSIS — Z66 Do not resuscitate: Secondary | ICD-10-CM | POA: Diagnosis not present

## 2016-07-27 DIAGNOSIS — K6389 Other specified diseases of intestine: Secondary | ICD-10-CM | POA: Diagnosis not present

## 2016-07-27 DIAGNOSIS — Z7982 Long term (current) use of aspirin: Secondary | ICD-10-CM | POA: Diagnosis not present

## 2016-07-27 DIAGNOSIS — Q438 Other specified congenital malformations of intestine: Secondary | ICD-10-CM | POA: Diagnosis not present

## 2016-07-28 DIAGNOSIS — R109 Unspecified abdominal pain: Secondary | ICD-10-CM | POA: Diagnosis not present

## 2016-07-28 DIAGNOSIS — Q438 Other specified congenital malformations of intestine: Secondary | ICD-10-CM | POA: Diagnosis not present

## 2016-07-28 DIAGNOSIS — R532 Functional quadriplegia: Secondary | ICD-10-CM | POA: Diagnosis not present

## 2016-07-28 DIAGNOSIS — R64 Cachexia: Secondary | ICD-10-CM | POA: Diagnosis not present

## 2016-07-28 DIAGNOSIS — R2681 Unsteadiness on feet: Secondary | ICD-10-CM | POA: Diagnosis not present

## 2016-07-28 DIAGNOSIS — J634 Siderosis: Secondary | ICD-10-CM | POA: Diagnosis not present

## 2016-07-28 DIAGNOSIS — G40109 Localization-related (focal) (partial) symptomatic epilepsy and epileptic syndromes with simple partial seizures, not intractable, without status epilepticus: Secondary | ICD-10-CM | POA: Diagnosis not present

## 2016-07-28 DIAGNOSIS — M6281 Muscle weakness (generalized): Secondary | ICD-10-CM | POA: Diagnosis not present

## 2016-07-28 DIAGNOSIS — F0150 Vascular dementia without behavioral disturbance: Secondary | ICD-10-CM | POA: Diagnosis not present

## 2016-07-28 DIAGNOSIS — R140 Abdominal distension (gaseous): Secondary | ICD-10-CM | POA: Diagnosis not present

## 2016-07-28 DIAGNOSIS — K567 Ileus, unspecified: Secondary | ICD-10-CM | POA: Diagnosis not present

## 2016-07-28 DIAGNOSIS — K6389 Other specified diseases of intestine: Secondary | ICD-10-CM | POA: Diagnosis not present

## 2016-07-28 DIAGNOSIS — E785 Hyperlipidemia, unspecified: Secondary | ICD-10-CM | POA: Diagnosis not present

## 2016-07-28 DIAGNOSIS — I10 Essential (primary) hypertension: Secondary | ICD-10-CM | POA: Diagnosis not present

## 2016-07-28 DIAGNOSIS — R278 Other lack of coordination: Secondary | ICD-10-CM | POA: Diagnosis not present

## 2016-07-28 DIAGNOSIS — R1900 Intra-abdominal and pelvic swelling, mass and lump, unspecified site: Secondary | ICD-10-CM | POA: Diagnosis not present

## 2016-07-28 DIAGNOSIS — K219 Gastro-esophageal reflux disease without esophagitis: Secondary | ICD-10-CM | POA: Diagnosis not present

## 2016-07-28 DIAGNOSIS — H44329 Siderosis of eye, unspecified eye: Secondary | ICD-10-CM | POA: Diagnosis not present

## 2016-07-28 DIAGNOSIS — R945 Abnormal results of liver function studies: Secondary | ICD-10-CM | POA: Diagnosis not present

## 2016-07-28 DIAGNOSIS — K5939 Other megacolon: Secondary | ICD-10-CM | POA: Diagnosis not present

## 2016-07-28 DIAGNOSIS — K56600 Partial intestinal obstruction, unspecified as to cause: Secondary | ICD-10-CM | POA: Diagnosis not present

## 2016-07-28 DIAGNOSIS — E119 Type 2 diabetes mellitus without complications: Secondary | ICD-10-CM | POA: Diagnosis not present

## 2016-07-28 DIAGNOSIS — N401 Enlarged prostate with lower urinary tract symptoms: Secondary | ICD-10-CM | POA: Diagnosis not present

## 2016-07-28 DIAGNOSIS — R2689 Other abnormalities of gait and mobility: Secondary | ICD-10-CM | POA: Diagnosis not present

## 2016-07-28 DIAGNOSIS — K598 Other specified functional intestinal disorders: Secondary | ICD-10-CM | POA: Diagnosis not present

## 2016-07-28 DIAGNOSIS — D519 Vitamin B12 deficiency anemia, unspecified: Secondary | ICD-10-CM | POA: Diagnosis not present

## 2016-08-01 DIAGNOSIS — E43 Unspecified severe protein-calorie malnutrition: Secondary | ICD-10-CM | POA: Diagnosis not present

## 2016-08-01 DIAGNOSIS — E785 Hyperlipidemia, unspecified: Secondary | ICD-10-CM | POA: Diagnosis not present

## 2016-08-01 DIAGNOSIS — J634 Siderosis: Secondary | ICD-10-CM | POA: Diagnosis not present

## 2016-08-01 DIAGNOSIS — K567 Ileus, unspecified: Secondary | ICD-10-CM | POA: Diagnosis not present

## 2016-08-01 DIAGNOSIS — K560 Paralytic ileus: Secondary | ICD-10-CM | POA: Diagnosis not present

## 2016-08-01 DIAGNOSIS — E119 Type 2 diabetes mellitus without complications: Secondary | ICD-10-CM | POA: Diagnosis not present

## 2016-08-01 DIAGNOSIS — D649 Anemia, unspecified: Secondary | ICD-10-CM | POA: Diagnosis not present

## 2016-08-01 DIAGNOSIS — R2681 Unsteadiness on feet: Secondary | ICD-10-CM | POA: Diagnosis not present

## 2016-08-01 DIAGNOSIS — M6281 Muscle weakness (generalized): Secondary | ICD-10-CM | POA: Diagnosis not present

## 2016-08-01 DIAGNOSIS — K56600 Partial intestinal obstruction, unspecified as to cause: Secondary | ICD-10-CM | POA: Diagnosis not present

## 2016-08-01 DIAGNOSIS — R278 Other lack of coordination: Secondary | ICD-10-CM | POA: Diagnosis not present

## 2016-08-01 DIAGNOSIS — R140 Abdominal distension (gaseous): Secondary | ICD-10-CM | POA: Diagnosis not present

## 2016-08-01 DIAGNOSIS — T83511A Infection and inflammatory reaction due to indwelling urethral catheter, initial encounter: Secondary | ICD-10-CM | POA: Diagnosis not present

## 2016-08-01 DIAGNOSIS — R269 Unspecified abnormalities of gait and mobility: Secondary | ICD-10-CM | POA: Diagnosis not present

## 2016-08-01 DIAGNOSIS — D519 Vitamin B12 deficiency anemia, unspecified: Secondary | ICD-10-CM | POA: Diagnosis not present

## 2016-08-01 DIAGNOSIS — R4182 Altered mental status, unspecified: Secondary | ICD-10-CM | POA: Diagnosis not present

## 2016-08-01 DIAGNOSIS — N5089 Other specified disorders of the male genital organs: Secondary | ICD-10-CM | POA: Diagnosis not present

## 2016-08-01 DIAGNOSIS — G9341 Metabolic encephalopathy: Secondary | ICD-10-CM | POA: Diagnosis not present

## 2016-08-01 DIAGNOSIS — E860 Dehydration: Secondary | ICD-10-CM | POA: Diagnosis not present

## 2016-08-01 DIAGNOSIS — N390 Urinary tract infection, site not specified: Secondary | ICD-10-CM | POA: Diagnosis not present

## 2016-08-01 DIAGNOSIS — R1084 Generalized abdominal pain: Secondary | ICD-10-CM | POA: Diagnosis not present

## 2016-08-01 DIAGNOSIS — I10 Essential (primary) hypertension: Secondary | ICD-10-CM | POA: Diagnosis not present

## 2016-08-01 DIAGNOSIS — B3749 Other urogenital candidiasis: Secondary | ICD-10-CM | POA: Diagnosis not present

## 2016-08-01 DIAGNOSIS — E876 Hypokalemia: Secondary | ICD-10-CM | POA: Diagnosis not present

## 2016-08-01 DIAGNOSIS — N401 Enlarged prostate with lower urinary tract symptoms: Secondary | ICD-10-CM | POA: Diagnosis not present

## 2016-08-01 DIAGNOSIS — K598 Other specified functional intestinal disorders: Secondary | ICD-10-CM | POA: Diagnosis not present

## 2016-08-01 DIAGNOSIS — E870 Hyperosmolality and hypernatremia: Secondary | ICD-10-CM | POA: Diagnosis not present

## 2016-08-04 DIAGNOSIS — E876 Hypokalemia: Secondary | ICD-10-CM | POA: Diagnosis not present

## 2016-08-04 DIAGNOSIS — R269 Unspecified abnormalities of gait and mobility: Secondary | ICD-10-CM | POA: Diagnosis not present

## 2016-08-04 DIAGNOSIS — K567 Ileus, unspecified: Secondary | ICD-10-CM | POA: Diagnosis not present

## 2016-08-07 DIAGNOSIS — K2980 Duodenitis without bleeding: Secondary | ICD-10-CM | POA: Diagnosis not present

## 2016-08-07 DIAGNOSIS — K567 Ileus, unspecified: Secondary | ICD-10-CM | POA: Diagnosis not present

## 2016-08-07 DIAGNOSIS — R140 Abdominal distension (gaseous): Secondary | ICD-10-CM | POA: Diagnosis not present

## 2016-08-07 DIAGNOSIS — K560 Paralytic ileus: Secondary | ICD-10-CM | POA: Diagnosis not present

## 2016-08-07 DIAGNOSIS — E46 Unspecified protein-calorie malnutrition: Secondary | ICD-10-CM | POA: Diagnosis not present

## 2016-08-07 DIAGNOSIS — R4182 Altered mental status, unspecified: Secondary | ICD-10-CM | POA: Diagnosis not present

## 2016-08-07 DIAGNOSIS — E860 Dehydration: Secondary | ICD-10-CM | POA: Diagnosis not present

## 2016-08-07 DIAGNOSIS — M6281 Muscle weakness (generalized): Secondary | ICD-10-CM | POA: Diagnosis not present

## 2016-08-07 DIAGNOSIS — E43 Unspecified severe protein-calorie malnutrition: Secondary | ICD-10-CM | POA: Diagnosis not present

## 2016-08-07 DIAGNOSIS — J634 Siderosis: Secondary | ICD-10-CM | POA: Diagnosis not present

## 2016-08-07 DIAGNOSIS — N390 Urinary tract infection, site not specified: Secondary | ICD-10-CM | POA: Diagnosis not present

## 2016-08-07 DIAGNOSIS — E876 Hypokalemia: Secondary | ICD-10-CM | POA: Diagnosis not present

## 2016-08-07 DIAGNOSIS — R1084 Generalized abdominal pain: Secondary | ICD-10-CM | POA: Diagnosis not present

## 2016-08-07 DIAGNOSIS — I10 Essential (primary) hypertension: Secondary | ICD-10-CM | POA: Diagnosis not present

## 2016-08-07 DIAGNOSIS — E559 Vitamin D deficiency, unspecified: Secondary | ICD-10-CM | POA: Diagnosis not present

## 2016-08-07 DIAGNOSIS — D500 Iron deficiency anemia secondary to blood loss (chronic): Secondary | ICD-10-CM | POA: Diagnosis not present

## 2016-08-07 DIAGNOSIS — E785 Hyperlipidemia, unspecified: Secondary | ICD-10-CM | POA: Diagnosis not present

## 2016-08-07 DIAGNOSIS — N5089 Other specified disorders of the male genital organs: Secondary | ICD-10-CM | POA: Diagnosis not present

## 2016-08-07 DIAGNOSIS — K922 Gastrointestinal hemorrhage, unspecified: Secondary | ICD-10-CM | POA: Diagnosis not present

## 2016-08-07 DIAGNOSIS — K219 Gastro-esophageal reflux disease without esophagitis: Secondary | ICD-10-CM | POA: Diagnosis not present

## 2016-08-07 DIAGNOSIS — G9341 Metabolic encephalopathy: Secondary | ICD-10-CM | POA: Diagnosis not present

## 2016-08-07 DIAGNOSIS — B3749 Other urogenital candidiasis: Secondary | ICD-10-CM | POA: Diagnosis not present

## 2016-08-07 DIAGNOSIS — E870 Hyperosmolality and hypernatremia: Secondary | ICD-10-CM | POA: Diagnosis not present

## 2016-08-07 DIAGNOSIS — N401 Enlarged prostate with lower urinary tract symptoms: Secondary | ICD-10-CM | POA: Diagnosis not present

## 2016-08-07 DIAGNOSIS — T83511A Infection and inflammatory reaction due to indwelling urethral catheter, initial encounter: Secondary | ICD-10-CM | POA: Diagnosis not present

## 2016-08-11 DIAGNOSIS — B3749 Other urogenital candidiasis: Secondary | ICD-10-CM | POA: Diagnosis not present

## 2016-08-11 DIAGNOSIS — E860 Dehydration: Secondary | ICD-10-CM | POA: Diagnosis not present

## 2016-08-11 DIAGNOSIS — R4701 Aphasia: Secondary | ICD-10-CM | POA: Diagnosis not present

## 2016-08-11 DIAGNOSIS — E119 Type 2 diabetes mellitus without complications: Secondary | ICD-10-CM | POA: Diagnosis not present

## 2016-08-11 DIAGNOSIS — G9341 Metabolic encephalopathy: Secondary | ICD-10-CM | POA: Diagnosis not present

## 2016-08-11 DIAGNOSIS — R52 Pain, unspecified: Secondary | ICD-10-CM | POA: Diagnosis not present

## 2016-08-11 DIAGNOSIS — R532 Functional quadriplegia: Secondary | ICD-10-CM | POA: Diagnosis not present

## 2016-08-11 DIAGNOSIS — K567 Ileus, unspecified: Secondary | ICD-10-CM | POA: Diagnosis not present

## 2016-08-11 DIAGNOSIS — T83511A Infection and inflammatory reaction due to indwelling urethral catheter, initial encounter: Secondary | ICD-10-CM | POA: Diagnosis not present

## 2016-08-11 DIAGNOSIS — N390 Urinary tract infection, site not specified: Secondary | ICD-10-CM | POA: Diagnosis not present

## 2016-08-11 DIAGNOSIS — R1084 Generalized abdominal pain: Secondary | ICD-10-CM | POA: Diagnosis not present

## 2016-08-11 DIAGNOSIS — N401 Enlarged prostate with lower urinary tract symptoms: Secondary | ICD-10-CM | POA: Diagnosis not present

## 2016-08-11 DIAGNOSIS — R140 Abdominal distension (gaseous): Secondary | ICD-10-CM | POA: Diagnosis not present

## 2016-08-11 DIAGNOSIS — K5669 Other partial intestinal obstruction: Secondary | ICD-10-CM | POA: Diagnosis not present

## 2016-08-11 DIAGNOSIS — E559 Vitamin D deficiency, unspecified: Secondary | ICD-10-CM | POA: Diagnosis not present

## 2016-08-11 DIAGNOSIS — K219 Gastro-esophageal reflux disease without esophagitis: Secondary | ICD-10-CM | POA: Diagnosis not present

## 2016-08-11 DIAGNOSIS — E46 Unspecified protein-calorie malnutrition: Secondary | ICD-10-CM | POA: Diagnosis not present

## 2016-08-11 DIAGNOSIS — A419 Sepsis, unspecified organism: Secondary | ICD-10-CM | POA: Diagnosis not present

## 2016-08-11 DIAGNOSIS — J634 Siderosis: Secondary | ICD-10-CM | POA: Diagnosis not present

## 2016-08-11 DIAGNOSIS — E872 Acidosis: Secondary | ICD-10-CM | POA: Diagnosis not present

## 2016-08-11 DIAGNOSIS — E876 Hypokalemia: Secondary | ICD-10-CM | POA: Diagnosis not present

## 2016-08-11 DIAGNOSIS — M6281 Muscle weakness (generalized): Secondary | ICD-10-CM | POA: Diagnosis not present

## 2016-08-11 DIAGNOSIS — E870 Hyperosmolality and hypernatremia: Secondary | ICD-10-CM | POA: Diagnosis not present

## 2016-08-16 DIAGNOSIS — K567 Ileus, unspecified: Secondary | ICD-10-CM | POA: Diagnosis not present

## 2016-08-16 DIAGNOSIS — E119 Type 2 diabetes mellitus without complications: Secondary | ICD-10-CM | POA: Diagnosis not present

## 2016-08-31 DIAGNOSIS — K560 Paralytic ileus: Secondary | ICD-10-CM | POA: Diagnosis not present

## 2016-08-31 DIAGNOSIS — B3749 Other urogenital candidiasis: Secondary | ICD-10-CM | POA: Diagnosis not present

## 2016-08-31 DIAGNOSIS — K567 Ileus, unspecified: Secondary | ICD-10-CM | POA: Diagnosis not present

## 2016-08-31 DIAGNOSIS — R52 Pain, unspecified: Secondary | ICD-10-CM | POA: Diagnosis not present

## 2016-08-31 DIAGNOSIS — R532 Functional quadriplegia: Secondary | ICD-10-CM | POA: Diagnosis not present

## 2016-08-31 DIAGNOSIS — T83511A Infection and inflammatory reaction due to indwelling urethral catheter, initial encounter: Secondary | ICD-10-CM | POA: Diagnosis not present

## 2016-08-31 DIAGNOSIS — N390 Urinary tract infection, site not specified: Secondary | ICD-10-CM | POA: Diagnosis not present

## 2016-08-31 DIAGNOSIS — A419 Sepsis, unspecified organism: Secondary | ICD-10-CM | POA: Diagnosis not present

## 2016-08-31 DIAGNOSIS — R140 Abdominal distension (gaseous): Secondary | ICD-10-CM | POA: Diagnosis not present

## 2016-08-31 DIAGNOSIS — E870 Hyperosmolality and hypernatremia: Secondary | ICD-10-CM | POA: Diagnosis not present

## 2016-08-31 DIAGNOSIS — R4701 Aphasia: Secondary | ICD-10-CM | POA: Diagnosis not present

## 2016-08-31 DIAGNOSIS — R1084 Generalized abdominal pain: Secondary | ICD-10-CM | POA: Diagnosis not present

## 2016-08-31 DIAGNOSIS — E860 Dehydration: Secondary | ICD-10-CM | POA: Diagnosis not present

## 2016-08-31 DIAGNOSIS — E876 Hypokalemia: Secondary | ICD-10-CM | POA: Diagnosis not present

## 2016-08-31 DIAGNOSIS — E872 Acidosis: Secondary | ICD-10-CM | POA: Diagnosis not present

## 2016-08-31 DIAGNOSIS — K5669 Other partial intestinal obstruction: Secondary | ICD-10-CM | POA: Diagnosis not present

## 2016-08-31 DIAGNOSIS — D649 Anemia, unspecified: Secondary | ICD-10-CM | POA: Diagnosis not present

## 2016-09-06 DIAGNOSIS — E119 Type 2 diabetes mellitus without complications: Secondary | ICD-10-CM | POA: Diagnosis not present

## 2016-09-06 DIAGNOSIS — I10 Essential (primary) hypertension: Secondary | ICD-10-CM | POA: Diagnosis not present

## 2016-09-06 DIAGNOSIS — D649 Anemia, unspecified: Secondary | ICD-10-CM | POA: Diagnosis not present

## 2016-09-06 DIAGNOSIS — K567 Ileus, unspecified: Secondary | ICD-10-CM | POA: Diagnosis not present

## 2016-09-06 DIAGNOSIS — M6281 Muscle weakness (generalized): Secondary | ICD-10-CM | POA: Diagnosis not present

## 2016-09-06 DIAGNOSIS — N390 Urinary tract infection, site not specified: Secondary | ICD-10-CM | POA: Diagnosis not present

## 2016-09-06 DIAGNOSIS — Z741 Need for assistance with personal care: Secondary | ICD-10-CM | POA: Diagnosis not present

## 2016-09-07 DIAGNOSIS — K567 Ileus, unspecified: Secondary | ICD-10-CM | POA: Diagnosis not present

## 2016-09-07 DIAGNOSIS — M6281 Muscle weakness (generalized): Secondary | ICD-10-CM | POA: Diagnosis not present

## 2016-09-07 DIAGNOSIS — Z741 Need for assistance with personal care: Secondary | ICD-10-CM | POA: Diagnosis not present

## 2016-09-07 DIAGNOSIS — N390 Urinary tract infection, site not specified: Secondary | ICD-10-CM | POA: Diagnosis not present

## 2016-09-08 DIAGNOSIS — M6281 Muscle weakness (generalized): Secondary | ICD-10-CM | POA: Diagnosis not present

## 2016-09-08 DIAGNOSIS — H43813 Vitreous degeneration, bilateral: Secondary | ICD-10-CM | POA: Diagnosis not present

## 2016-09-08 DIAGNOSIS — E1139 Type 2 diabetes mellitus with other diabetic ophthalmic complication: Secondary | ICD-10-CM | POA: Diagnosis not present

## 2016-09-08 DIAGNOSIS — Z741 Need for assistance with personal care: Secondary | ICD-10-CM | POA: Diagnosis not present

## 2016-09-08 DIAGNOSIS — H2513 Age-related nuclear cataract, bilateral: Secondary | ICD-10-CM | POA: Diagnosis not present

## 2016-09-08 DIAGNOSIS — N390 Urinary tract infection, site not specified: Secondary | ICD-10-CM | POA: Diagnosis not present

## 2016-09-08 DIAGNOSIS — K567 Ileus, unspecified: Secondary | ICD-10-CM | POA: Diagnosis not present

## 2016-09-08 DIAGNOSIS — E119 Type 2 diabetes mellitus without complications: Secondary | ICD-10-CM | POA: Diagnosis not present

## 2016-09-10 DIAGNOSIS — M6281 Muscle weakness (generalized): Secondary | ICD-10-CM | POA: Diagnosis not present

## 2016-09-10 DIAGNOSIS — N390 Urinary tract infection, site not specified: Secondary | ICD-10-CM | POA: Diagnosis not present

## 2016-09-10 DIAGNOSIS — K567 Ileus, unspecified: Secondary | ICD-10-CM | POA: Diagnosis not present

## 2016-09-10 DIAGNOSIS — Z741 Need for assistance with personal care: Secondary | ICD-10-CM | POA: Diagnosis not present

## 2016-09-11 DIAGNOSIS — K567 Ileus, unspecified: Secondary | ICD-10-CM | POA: Diagnosis not present

## 2016-09-11 DIAGNOSIS — M6281 Muscle weakness (generalized): Secondary | ICD-10-CM | POA: Diagnosis not present

## 2016-09-11 DIAGNOSIS — N390 Urinary tract infection, site not specified: Secondary | ICD-10-CM | POA: Diagnosis not present

## 2016-09-11 DIAGNOSIS — Z741 Need for assistance with personal care: Secondary | ICD-10-CM | POA: Diagnosis not present

## 2016-09-12 DIAGNOSIS — N390 Urinary tract infection, site not specified: Secondary | ICD-10-CM | POA: Diagnosis not present

## 2016-09-12 DIAGNOSIS — Z741 Need for assistance with personal care: Secondary | ICD-10-CM | POA: Diagnosis not present

## 2016-09-12 DIAGNOSIS — K567 Ileus, unspecified: Secondary | ICD-10-CM | POA: Diagnosis not present

## 2016-09-12 DIAGNOSIS — M6281 Muscle weakness (generalized): Secondary | ICD-10-CM | POA: Diagnosis not present

## 2016-09-13 DIAGNOSIS — M6281 Muscle weakness (generalized): Secondary | ICD-10-CM | POA: Diagnosis not present

## 2016-09-13 DIAGNOSIS — N390 Urinary tract infection, site not specified: Secondary | ICD-10-CM | POA: Diagnosis not present

## 2016-09-13 DIAGNOSIS — Z741 Need for assistance with personal care: Secondary | ICD-10-CM | POA: Diagnosis not present

## 2016-09-13 DIAGNOSIS — K567 Ileus, unspecified: Secondary | ICD-10-CM | POA: Diagnosis not present

## 2016-09-14 DIAGNOSIS — K567 Ileus, unspecified: Secondary | ICD-10-CM | POA: Diagnosis not present

## 2016-09-14 DIAGNOSIS — Z741 Need for assistance with personal care: Secondary | ICD-10-CM | POA: Diagnosis not present

## 2016-09-14 DIAGNOSIS — M6281 Muscle weakness (generalized): Secondary | ICD-10-CM | POA: Diagnosis not present

## 2016-09-14 DIAGNOSIS — N390 Urinary tract infection, site not specified: Secondary | ICD-10-CM | POA: Diagnosis not present

## 2016-09-15 DIAGNOSIS — M6281 Muscle weakness (generalized): Secondary | ICD-10-CM | POA: Diagnosis not present

## 2016-09-15 DIAGNOSIS — N390 Urinary tract infection, site not specified: Secondary | ICD-10-CM | POA: Diagnosis not present

## 2016-09-15 DIAGNOSIS — Z741 Need for assistance with personal care: Secondary | ICD-10-CM | POA: Diagnosis not present

## 2016-09-15 DIAGNOSIS — K567 Ileus, unspecified: Secondary | ICD-10-CM | POA: Diagnosis not present

## 2016-09-16 DIAGNOSIS — N390 Urinary tract infection, site not specified: Secondary | ICD-10-CM | POA: Diagnosis not present

## 2016-09-16 DIAGNOSIS — Z741 Need for assistance with personal care: Secondary | ICD-10-CM | POA: Diagnosis not present

## 2016-09-16 DIAGNOSIS — K567 Ileus, unspecified: Secondary | ICD-10-CM | POA: Diagnosis not present

## 2016-09-16 DIAGNOSIS — M6281 Muscle weakness (generalized): Secondary | ICD-10-CM | POA: Diagnosis not present

## 2016-09-17 DIAGNOSIS — Z741 Need for assistance with personal care: Secondary | ICD-10-CM | POA: Diagnosis not present

## 2016-09-17 DIAGNOSIS — N390 Urinary tract infection, site not specified: Secondary | ICD-10-CM | POA: Diagnosis not present

## 2016-09-17 DIAGNOSIS — K567 Ileus, unspecified: Secondary | ICD-10-CM | POA: Diagnosis not present

## 2016-09-17 DIAGNOSIS — M6281 Muscle weakness (generalized): Secondary | ICD-10-CM | POA: Diagnosis not present

## 2016-09-18 DIAGNOSIS — M6281 Muscle weakness (generalized): Secondary | ICD-10-CM | POA: Diagnosis not present

## 2016-09-18 DIAGNOSIS — Z741 Need for assistance with personal care: Secondary | ICD-10-CM | POA: Diagnosis not present

## 2016-09-18 DIAGNOSIS — N390 Urinary tract infection, site not specified: Secondary | ICD-10-CM | POA: Diagnosis not present

## 2016-09-18 DIAGNOSIS — K567 Ileus, unspecified: Secondary | ICD-10-CM | POA: Diagnosis not present

## 2016-09-19 DIAGNOSIS — M6281 Muscle weakness (generalized): Secondary | ICD-10-CM | POA: Diagnosis not present

## 2016-09-19 DIAGNOSIS — N390 Urinary tract infection, site not specified: Secondary | ICD-10-CM | POA: Diagnosis not present

## 2016-09-19 DIAGNOSIS — Z741 Need for assistance with personal care: Secondary | ICD-10-CM | POA: Diagnosis not present

## 2016-09-19 DIAGNOSIS — K567 Ileus, unspecified: Secondary | ICD-10-CM | POA: Diagnosis not present

## 2016-09-20 DIAGNOSIS — Z741 Need for assistance with personal care: Secondary | ICD-10-CM | POA: Diagnosis not present

## 2016-09-20 DIAGNOSIS — K567 Ileus, unspecified: Secondary | ICD-10-CM | POA: Diagnosis not present

## 2016-09-20 DIAGNOSIS — M6281 Muscle weakness (generalized): Secondary | ICD-10-CM | POA: Diagnosis not present

## 2016-09-21 DIAGNOSIS — Z741 Need for assistance with personal care: Secondary | ICD-10-CM | POA: Diagnosis not present

## 2016-09-21 DIAGNOSIS — M6281 Muscle weakness (generalized): Secondary | ICD-10-CM | POA: Diagnosis not present

## 2016-09-21 DIAGNOSIS — K567 Ileus, unspecified: Secondary | ICD-10-CM | POA: Diagnosis not present

## 2016-09-22 DIAGNOSIS — K567 Ileus, unspecified: Secondary | ICD-10-CM | POA: Diagnosis not present

## 2016-09-22 DIAGNOSIS — I10 Essential (primary) hypertension: Secondary | ICD-10-CM | POA: Diagnosis not present

## 2016-09-22 DIAGNOSIS — M6281 Muscle weakness (generalized): Secondary | ICD-10-CM | POA: Diagnosis not present

## 2016-09-22 DIAGNOSIS — Z79899 Other long term (current) drug therapy: Secondary | ICD-10-CM | POA: Diagnosis not present

## 2016-09-22 DIAGNOSIS — Z741 Need for assistance with personal care: Secondary | ICD-10-CM | POA: Diagnosis not present

## 2016-09-22 DIAGNOSIS — D649 Anemia, unspecified: Secondary | ICD-10-CM | POA: Diagnosis not present

## 2016-09-25 DIAGNOSIS — M6281 Muscle weakness (generalized): Secondary | ICD-10-CM | POA: Diagnosis not present

## 2016-09-25 DIAGNOSIS — K567 Ileus, unspecified: Secondary | ICD-10-CM | POA: Diagnosis not present

## 2016-09-25 DIAGNOSIS — Z741 Need for assistance with personal care: Secondary | ICD-10-CM | POA: Diagnosis not present

## 2016-09-26 DIAGNOSIS — N390 Urinary tract infection, site not specified: Secondary | ICD-10-CM | POA: Diagnosis not present

## 2016-09-26 DIAGNOSIS — K567 Ileus, unspecified: Secondary | ICD-10-CM | POA: Diagnosis not present

## 2016-09-26 DIAGNOSIS — M6281 Muscle weakness (generalized): Secondary | ICD-10-CM | POA: Diagnosis not present

## 2016-09-26 DIAGNOSIS — Z741 Need for assistance with personal care: Secondary | ICD-10-CM | POA: Diagnosis not present

## 2016-09-26 DIAGNOSIS — R1084 Generalized abdominal pain: Secondary | ICD-10-CM | POA: Diagnosis not present

## 2016-09-26 DIAGNOSIS — D649 Anemia, unspecified: Secondary | ICD-10-CM | POA: Diagnosis not present

## 2016-09-27 DIAGNOSIS — N390 Urinary tract infection, site not specified: Secondary | ICD-10-CM | POA: Diagnosis not present

## 2016-09-27 DIAGNOSIS — K560 Paralytic ileus: Secondary | ICD-10-CM | POA: Diagnosis not present

## 2016-09-27 DIAGNOSIS — Z888 Allergy status to other drugs, medicaments and biological substances status: Secondary | ICD-10-CM | POA: Diagnosis not present

## 2016-09-29 DIAGNOSIS — Z741 Need for assistance with personal care: Secondary | ICD-10-CM | POA: Diagnosis not present

## 2016-09-29 DIAGNOSIS — M6281 Muscle weakness (generalized): Secondary | ICD-10-CM | POA: Diagnosis not present

## 2016-09-29 DIAGNOSIS — K567 Ileus, unspecified: Secondary | ICD-10-CM | POA: Diagnosis not present

## 2016-10-03 DIAGNOSIS — K598 Other specified functional intestinal disorders: Secondary | ICD-10-CM | POA: Diagnosis not present

## 2016-10-03 DIAGNOSIS — E870 Hyperosmolality and hypernatremia: Secondary | ICD-10-CM | POA: Diagnosis not present

## 2016-10-03 DIAGNOSIS — R0989 Other specified symptoms and signs involving the circulatory and respiratory systems: Secondary | ICD-10-CM | POA: Diagnosis not present

## 2016-10-03 DIAGNOSIS — J634 Siderosis: Secondary | ICD-10-CM | POA: Diagnosis not present

## 2016-10-03 DIAGNOSIS — N179 Acute kidney failure, unspecified: Secondary | ICD-10-CM | POA: Diagnosis not present

## 2016-10-03 DIAGNOSIS — T83511A Infection and inflammatory reaction due to indwelling urethral catheter, initial encounter: Secondary | ICD-10-CM | POA: Diagnosis not present

## 2016-10-03 DIAGNOSIS — K567 Ileus, unspecified: Secondary | ICD-10-CM | POA: Diagnosis not present

## 2016-10-03 DIAGNOSIS — K6389 Other specified diseases of intestine: Secondary | ICD-10-CM | POA: Diagnosis not present

## 2016-10-03 DIAGNOSIS — Y828 Other medical devices associated with adverse incidents: Secondary | ICD-10-CM | POA: Diagnosis not present

## 2016-10-03 DIAGNOSIS — R4701 Aphasia: Secondary | ICD-10-CM | POA: Diagnosis not present

## 2016-10-03 DIAGNOSIS — R140 Abdominal distension (gaseous): Secondary | ICD-10-CM | POA: Diagnosis not present

## 2016-10-03 DIAGNOSIS — E119 Type 2 diabetes mellitus without complications: Secondary | ICD-10-CM | POA: Diagnosis not present

## 2016-10-03 DIAGNOSIS — K560 Paralytic ileus: Secondary | ICD-10-CM | POA: Diagnosis not present

## 2016-10-03 DIAGNOSIS — N401 Enlarged prostate with lower urinary tract symptoms: Secondary | ICD-10-CM | POA: Diagnosis not present

## 2016-10-03 DIAGNOSIS — M6281 Muscle weakness (generalized): Secondary | ICD-10-CM | POA: Diagnosis not present

## 2016-10-03 DIAGNOSIS — K219 Gastro-esophageal reflux disease without esophagitis: Secondary | ICD-10-CM | POA: Diagnosis not present

## 2016-10-03 DIAGNOSIS — R4182 Altered mental status, unspecified: Secondary | ICD-10-CM | POA: Diagnosis not present

## 2016-10-03 DIAGNOSIS — R9082 White matter disease, unspecified: Secondary | ICD-10-CM | POA: Diagnosis not present

## 2016-10-03 DIAGNOSIS — K56609 Unspecified intestinal obstruction, unspecified as to partial versus complete obstruction: Secondary | ICD-10-CM | POA: Diagnosis not present

## 2016-10-03 DIAGNOSIS — J690 Pneumonitis due to inhalation of food and vomit: Secondary | ICD-10-CM | POA: Diagnosis not present

## 2016-10-03 DIAGNOSIS — G40109 Localization-related (focal) (partial) symptomatic epilepsy and epileptic syndromes with simple partial seizures, not intractable, without status epilepticus: Secondary | ICD-10-CM | POA: Diagnosis not present

## 2016-10-03 DIAGNOSIS — N390 Urinary tract infection, site not specified: Secondary | ICD-10-CM | POA: Diagnosis not present

## 2016-10-03 DIAGNOSIS — R918 Other nonspecific abnormal finding of lung field: Secondary | ICD-10-CM | POA: Diagnosis not present

## 2016-10-03 DIAGNOSIS — Z741 Need for assistance with personal care: Secondary | ICD-10-CM | POA: Diagnosis not present

## 2016-10-03 DIAGNOSIS — R569 Unspecified convulsions: Secondary | ICD-10-CM | POA: Diagnosis not present

## 2016-10-03 DIAGNOSIS — R079 Chest pain, unspecified: Secondary | ICD-10-CM | POA: Diagnosis not present

## 2016-10-03 DIAGNOSIS — M6250 Muscle wasting and atrophy, not elsewhere classified, unspecified site: Secondary | ICD-10-CM | POA: Diagnosis not present

## 2016-10-03 DIAGNOSIS — I10 Essential (primary) hypertension: Secondary | ICD-10-CM | POA: Diagnosis not present

## 2016-10-03 DIAGNOSIS — G9341 Metabolic encephalopathy: Secondary | ICD-10-CM | POA: Diagnosis not present

## 2016-10-03 DIAGNOSIS — E46 Unspecified protein-calorie malnutrition: Secondary | ICD-10-CM | POA: Diagnosis not present

## 2016-10-03 DIAGNOSIS — E876 Hypokalemia: Secondary | ICD-10-CM | POA: Diagnosis not present

## 2016-10-09 DIAGNOSIS — E119 Type 2 diabetes mellitus without complications: Secondary | ICD-10-CM | POA: Diagnosis not present

## 2016-10-09 DIAGNOSIS — K598 Other specified functional intestinal disorders: Secondary | ICD-10-CM | POA: Diagnosis not present

## 2016-10-11 DIAGNOSIS — L89152 Pressure ulcer of sacral region, stage 2: Secondary | ICD-10-CM | POA: Diagnosis not present

## 2016-10-11 DIAGNOSIS — K219 Gastro-esophageal reflux disease without esophagitis: Secondary | ICD-10-CM | POA: Diagnosis not present

## 2016-10-11 DIAGNOSIS — Z741 Need for assistance with personal care: Secondary | ICD-10-CM | POA: Diagnosis not present

## 2016-10-11 DIAGNOSIS — E119 Type 2 diabetes mellitus without complications: Secondary | ICD-10-CM | POA: Diagnosis not present

## 2016-10-11 DIAGNOSIS — R569 Unspecified convulsions: Secondary | ICD-10-CM | POA: Diagnosis not present

## 2016-10-11 DIAGNOSIS — G9341 Metabolic encephalopathy: Secondary | ICD-10-CM | POA: Diagnosis not present

## 2016-10-11 DIAGNOSIS — Z79899 Other long term (current) drug therapy: Secondary | ICD-10-CM | POA: Diagnosis not present

## 2016-10-11 DIAGNOSIS — K598 Other specified functional intestinal disorders: Secondary | ICD-10-CM | POA: Diagnosis not present

## 2016-10-11 DIAGNOSIS — G40909 Epilepsy, unspecified, not intractable, without status epilepticus: Secondary | ICD-10-CM | POA: Diagnosis not present

## 2016-10-11 DIAGNOSIS — I10 Essential (primary) hypertension: Secondary | ICD-10-CM | POA: Diagnosis not present

## 2016-10-11 DIAGNOSIS — N401 Enlarged prostate with lower urinary tract symptoms: Secondary | ICD-10-CM | POA: Diagnosis not present

## 2016-10-11 DIAGNOSIS — K567 Ileus, unspecified: Secondary | ICD-10-CM | POA: Diagnosis not present

## 2016-10-11 DIAGNOSIS — K56609 Unspecified intestinal obstruction, unspecified as to partial versus complete obstruction: Secondary | ICD-10-CM | POA: Diagnosis not present

## 2016-10-11 DIAGNOSIS — K315 Obstruction of duodenum: Secondary | ICD-10-CM | POA: Diagnosis not present

## 2016-10-11 DIAGNOSIS — D649 Anemia, unspecified: Secondary | ICD-10-CM | POA: Diagnosis not present

## 2016-10-11 DIAGNOSIS — J634 Siderosis: Secondary | ICD-10-CM | POA: Diagnosis not present

## 2016-10-11 DIAGNOSIS — M6281 Muscle weakness (generalized): Secondary | ICD-10-CM | POA: Diagnosis not present

## 2016-10-12 NOTE — Addendum Note (Signed)
Addendum  created 10/12/16 1423 by Roberts Gaudy, MD   Sign clinical note

## 2016-10-13 DIAGNOSIS — G40909 Epilepsy, unspecified, not intractable, without status epilepticus: Secondary | ICD-10-CM | POA: Diagnosis not present

## 2016-10-13 DIAGNOSIS — E119 Type 2 diabetes mellitus without complications: Secondary | ICD-10-CM | POA: Diagnosis not present

## 2016-10-13 DIAGNOSIS — K315 Obstruction of duodenum: Secondary | ICD-10-CM | POA: Diagnosis not present

## 2017-05-21 DEATH — deceased

## 2017-12-27 IMAGING — DX DG CHEST 1V PORT
1 series · 1 of 1 positions shown · non-contrast
Comparison: Portable exam 3263 hours compared 10/19/2014

CLINICAL DATA: PICC line placement

EXAM:
PORTABLE CHEST 1 VIEW

[chest ap]
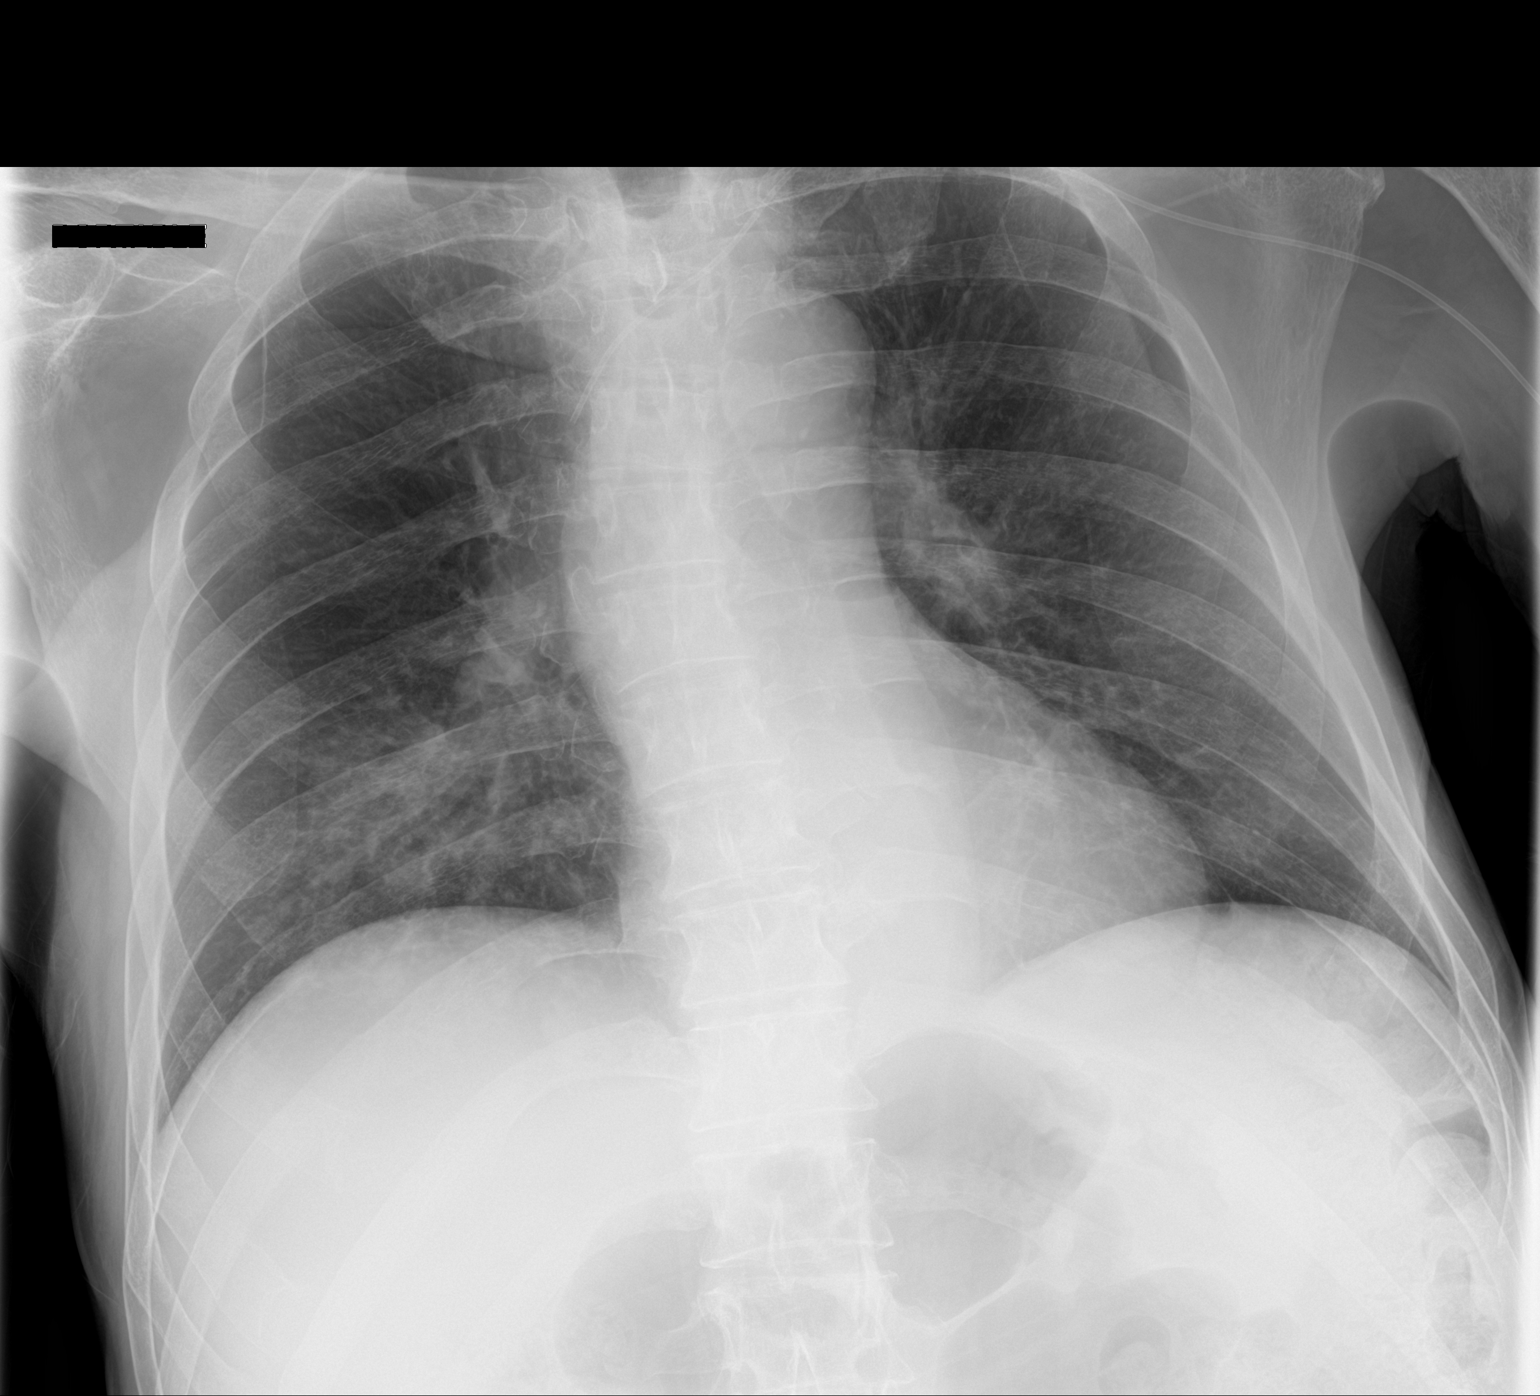

[1 of 1 positions shown; findings below may reference images not displayed]

FINDINGS: LEFT arm PICC line tip projects over the proximal SVC near its
confluence.

Normal heart size, mediastinal contours and pulmonary vascularity.

RIGHT basilar infiltrate question pneumonia.

Remaining lungs clear.

No pleural effusion or pneumothorax.

Bones unremarkable.
IMPRESSION: Tip of RIGHT arm PICC line projects over proximal SVC near its
confluence.

RIGHT basilar infiltrate question pneumonia.
